# Patient Record
Sex: Male | Born: 1950 | Race: White | Hispanic: No | Smoking: Former smoker
Health system: Southern US, Community
[De-identification: ages and names within clinical notes are randomized; demographics above are authoritative.]

## PROBLEM LIST (undated history)

## (undated) DIAGNOSIS — J45909 Unspecified asthma, uncomplicated: Secondary | ICD-10-CM

## (undated) DIAGNOSIS — I10 Essential (primary) hypertension: Secondary | ICD-10-CM

## (undated) DIAGNOSIS — G4733 Obstructive sleep apnea (adult) (pediatric): Secondary | ICD-10-CM

## (undated) DIAGNOSIS — E785 Hyperlipidemia, unspecified: Secondary | ICD-10-CM

## (undated) DIAGNOSIS — I639 Cerebral infarction, unspecified: Secondary | ICD-10-CM

## (undated) DIAGNOSIS — K219 Gastro-esophageal reflux disease without esophagitis: Secondary | ICD-10-CM

## (undated) DIAGNOSIS — Z9989 Dependence on other enabling machines and devices: Secondary | ICD-10-CM

## (undated) HISTORY — DX: Cerebral infarction, unspecified: I63.9

## (undated) HISTORY — DX: Essential (primary) hypertension: I10

## (undated) HISTORY — PX: HERNIA REPAIR: SHX51

## (undated) HISTORY — DX: Hyperlipidemia, unspecified: E78.5

## (undated) HISTORY — DX: Unspecified asthma, uncomplicated: J45.909

---

## 1994-05-15 HISTORY — PX: ABDOMINAL HERNIA REPAIR: SHX539

## 2016-03-23 ENCOUNTER — Encounter: Payer: Self-pay | Admitting: Neurology

## 2016-04-14 ENCOUNTER — Institutional Professional Consult (permissible substitution): Payer: Self-pay | Admitting: Pulmonary Disease

## 2016-04-17 ENCOUNTER — Ambulatory Visit (INDEPENDENT_AMBULATORY_CARE_PROVIDER_SITE_OTHER): Payer: Medicare Other | Admitting: Pulmonary Disease

## 2016-04-17 ENCOUNTER — Encounter: Payer: Self-pay | Admitting: Pulmonary Disease

## 2016-04-17 VITALS — BP 122/80 | HR 66 | Ht 66.0 in | Wt 223.0 lb

## 2016-04-17 DIAGNOSIS — G4733 Obstructive sleep apnea (adult) (pediatric): Secondary | ICD-10-CM

## 2016-04-17 DIAGNOSIS — Z9989 Dependence on other enabling machines and devices: Secondary | ICD-10-CM

## 2016-04-17 NOTE — Progress Notes (Signed)
Past Surgical History He  has a past surgical history that includes Hernia repair.  Not on File  Family History His family history includes Heart disease in his brother, father, and mother.  Social History He  reports that he quit smoking about 25 years ago. His smoking use included Cigarettes. He has a 15.00 pack-year smoking history. He has never used smokeless tobacco. He reports that he drinks alcohol. He reports that he does not use drugs.  Review of systems Constitutional: Negative for fever and unexpected weight change.  HENT: Negative for congestion, dental problem, ear pain, nosebleeds, postnasal drip, rhinorrhea, sinus pressure, sneezing, sore throat and trouble swallowing.   Eyes: Negative for redness and itching.  Respiratory: Negative for cough, chest tightness, shortness of breath and wheezing.   Cardiovascular: Positive for palpitations. Negative for leg swelling.  Gastrointestinal: Negative for nausea and vomiting.       Acid Heartburn  Genitourinary: Negative for dysuria.  Musculoskeletal: Negative for joint swelling.  Skin: Negative for rash.  Neurological: Negative for headaches.  Hematological: Does not bruise/bleed easily.  Psychiatric/Behavioral: Negative for dysphoric mood. The patient is not nervous/anxious.     No current outpatient prescriptions on file prior to visit.   No current facility-administered medications on file prior to visit.     Chief Complaint  Patient presents with  . SLEEP CONSULT    Referred by Dr Jacinto HalimGanji. Sleep study [place] before 2003. Wears CPAP nightly and with naps.  DME: [facility]: 9    Past medical history He  has a past medical history of Asthma; HTN (hypertension); Hyperlipidemia; OSA (obstructive sleep apnea); and Stroke (HCC).  Vital signs BP 122/80 (BP Location: Left Arm, Cuff Size: Normal)   Pulse 66   Ht 5\' 6"  (1.676 m)   Wt 223 lb (101.2 kg)   SpO2 96%   BMI 35.99 kg/m   History of Present  Illness Joe Underwood is a 65 y.o. male for evaluation of sleep problems.  He had sleep study years ago, and has been on CPAP ever since.  He uses nasal pillows mask.  Mask causes nose irritation at first, but gets better as he uses mask.  He has noticed waking up at times feeling like he can't breath.  He has strong family history of cardiac disease and is followed by Dr. Jacinto HalimGanji.  He had several strokes recently and is scheduled for appointment with Dr. Pearlean BrownieSethi tomorrow.  He goes to sleep at 9 pm.  He falls asleep 15 minus.  He wakes up some times to use the bathroom.  He gets out of bed at 6 am.  He feels okay in the morning.  He denies morning headache.  He does not use anything to help him fall sleep or stay awake.  He denies sleep walking, sleep talking, bruxism, or nightmares.  There is no history of restless legs.  He denies sleep hallucinations, sleep paralysis, or cataplexy.  The Epworth score is 9 out of 24.   Physical Exam:  General - No distress ENT - No sinus tenderness, no oral exudate, no LAN, no thyromegaly, TM clear, pupils equal/reactive, MP 3, elongated uvula Cardiac - s1s2 regular, no murmur, pulses symmetric Chest - No wheeze/rales/dullness, good air entry, normal respiratory excursion Back - No focal tenderness Abd - Soft, non-tender, no organomegaly, + bowel sounds Ext - No edema Neuro - Normal strength, cranial nerves intact Skin - No rashes Psych - Normal mood, and behavior  Discussion: He has history of obstructive  sleep apnea, and has been using CPAP.  He has history of hypertension and recurrent strokes.  He reports more trouble with his sleep, and apnea events even with CPAP.  Assessment/plan:  Obstructive sleep apnea. - will get copy of his CPAP download  - depending on results will determine if he needs to have pressure setting adjusted, or if he needs additional in lab sleep testing - advised him to try using smaller nasal pillows to determine if this  alleviates nasal irrigation when he first starts using new nasal pillows mask   Patient Instructions  Will get copy of CPAP report  Follow up in 4 months   Coralyn HellingVineet Percival Glasheen, M.D. Pager [phone] 04/17/2016, 10:56 AM

## 2016-04-17 NOTE — Progress Notes (Signed)
   Subjective:    Patient ID: Joe Underwood, male    DOB: 1950-10-29, 65 y.o.   MRN: [id number]  HPI    Review of Systems  Constitutional: Negative for fever and unexpected weight change.  HENT: Negative for congestion, dental problem, ear pain, nosebleeds, postnasal drip, rhinorrhea, sinus pressure, sneezing, sore throat and trouble swallowing.   Eyes: Negative for redness and itching.  Respiratory: Negative for cough, chest tightness, shortness of breath and wheezing.   Cardiovascular: Positive for palpitations. Negative for leg swelling.  Gastrointestinal: Negative for nausea and vomiting.       Acid Heartburn  Genitourinary: Negative for dysuria.  Musculoskeletal: Negative for joint swelling.  Skin: Negative for rash.  Neurological: Negative for headaches.  Hematological: Does not bruise/bleed easily.  Psychiatric/Behavioral: Negative for dysphoric mood. The patient is not nervous/anxious.        Objective:   Physical Exam        Assessment & Plan:

## 2016-04-17 NOTE — Patient Instructions (Signed)
Will get copy of CPAP report  Follow up in 4 months

## 2016-04-18 ENCOUNTER — Ambulatory Visit (INDEPENDENT_AMBULATORY_CARE_PROVIDER_SITE_OTHER): Payer: Medicare Other | Admitting: Neurology

## 2016-04-18 ENCOUNTER — Encounter: Payer: Self-pay | Admitting: Neurology

## 2016-04-18 VITALS — BP 129/82 | HR 65 | Ht 66.0 in | Wt 224.0 lb

## 2016-04-18 DIAGNOSIS — I639 Cerebral infarction, unspecified: Secondary | ICD-10-CM

## 2016-04-18 DIAGNOSIS — Q211 Atrial septal defect: Secondary | ICD-10-CM

## 2016-04-18 DIAGNOSIS — Q2112 Patent foramen ovale: Secondary | ICD-10-CM | POA: Insufficient documentation

## 2016-04-18 NOTE — Progress Notes (Signed)
[facility] [address] [place] [address] [phone]       OFFICE CONSULT NOTE  Joe. Joe Underwood Date of Birth:  01-Jan-1951 Medical Record Number:  [id number]   Referring MD:  Mady GemmaKristen Kaplan, PA-C  Reason for Referral:  stroke HPI: Joe Underwood is a pleasant [age] year Caucasian male who [place] had a small stroke. On 11/9 /2017 he was sitting on a computer when he noticed an onset of right hand weakness and difficulty holding a pen picking up the phone using his fingers. [facility] he was evaluated by tele-stroke. CT scan of the head was unremarkable. I have personally reviewed imaging disc has temperature of the CT scan which was normal. MRI images available a incomplete and did not show diffusion-weighted imaging but as per the report patient had a small left. Post central guyrus infarct. 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 MRA of the brain had fetal pattern of origin of the left posterior cerebral artery but no large vessel occlusion or stenosis. Transthoracic echo showed normal ejection fraction. LDL cholesterol was 69 mg percent. Carotid ultrasound showed no significant large vessel stenosis. Patient was seen by neurologist Dr. Judithann [facility]enSparks [facility]. Telemetry monitoring did not reveal cardiac and mass. Patient has had outpatient heart monitor placed yesterday by cardiologist Dr. Mendel CorningiGangi. Patient gives prior history of stroke in 2003 when he had some eye movement abnormalities and double vision. He was found to have a patent foramen ovale and that time. He is scheduled to undergo transesophageal echocardiogram  With Dr Jacinto HalimGanji  on 12/19 17. Patient has been on dual antiplatelet therapy of aspirin and Plavix since his initial stroke and this has been continued though he has no definite long-term indications  informed cardiac stents. Past medical history is fairly benign except mild hypertension and hypertriglyceridemia. Patient does not smoke or drink. He states is physically quite active and and states his right hand weakness improved completely within a few days and he has no residual deficits. He had no recurrent stroke or TIA symptoms. His starting aspirin and Plavix well without significant bruising or bleeding. ROS:   14 system review of systems is positive for  fatigue, palpitations, allergies, slurred speech, confusion and all other systems negative PMH:  Past Medical History:  Diagnosis Date  . Asthma   . HTN (hypertension)   . Hyperlipidemia   . OSA (obstructive sleep apnea)   . Stroke [facility](HCC)    multiple    Social History:  Social History   Social History  . Marital status: Married    Spouse name: N/A  . Number of children: N/A  . Years of education: N/A   Occupational History  . retired    Social History Main Topics  . Smoking status: Former Smoker    Packs/day: 1.00    Years: 15.00    Types: Cigarettes    Quit date: 05/15/1990  . Smokeless tobacco: Never Used  . Alcohol use 0.6 oz/week    1 Shots of liquor per week     Comment: 2 liquor drinks /day  . Drug use: No  . Sexual activity: Not on file   Other Topics Concern  . Not on file   Social History Narrative  . No narrative on file    Medications:   Current Outpatient Prescriptions on File Prior to  Visit  Medication Sig Dispense Refill  . albuterol (PROVENTIL HFA;VENTOLIN HFA) 108 (90 Base) MCG/ACT inhaler Inhale 2 puffs into the lungs every 6 (six) hours as needed for wheezing or shortness of breath.    Marland Kitchen. aspirin 325 MG tablet Take 325 mg by mouth daily.    . Azelastine-Fluticasone (DYMISTA) 137-50 MCG/ACT SUSP Place 2 sprays into the nose daily.    . famotidine (PEPCID) 20 MG tablet Take 20 mg by mouth 2 (two) times daily.    . fexofenadine (ALLEGRA) 180 MG tablet Take 180 mg by mouth daily.    Marland Kitchen.  lisinopril (PRINIVIL,ZESTRIL) 10 MG tablet Take 10 mg by mouth daily.     No current facility-administered medications on file prior to visit.     Allergies:  No Known Allergies  Physical Exam General: mildly obese middle aged Caucasian male, seated, in no evident distress Head: head normocephalic and atraumatic.   Neck: supple with no carotid or supraclavicular bruits Cardiovascular: regular rate and rhythm, no murmurs Musculoskeletal: no deformity Skin:  no rash/petichiae Vascular:  Normal pulses all extremities  Neurologic Exam Mental Status: Awake and fully alert. Oriented to place and time. Recent and remote memory intact. Attention span, concentration and fund of knowledge appropriate. Mood and affect appropriate.  Cranial Nerves: Fundoscopic exam reveals sharp disc margins. Pupils equal, briskly reactive to light. Extraocular movements full without nystagmus. Visual fields full to confrontation. Hearing intact. Facial sensation intact. Face, tongue, palate moves normally and symmetrically.  Motor: Normal bulk and tone. Normal strength in all tested extremity muscles. Sensory.: intact to touch , pinprick , position and vibratory sensation.  Coordination: Rapid alternating movements normal in all extremities. Finger-to-nose and heel-to-shin performed accurately bilaterally. Gait and Station: Arises from chair without difficulty. Stance is normal. Gait demonstrates normal stride length and balance . Able to heel, toe and tandem walk without difficulty.  Reflexes: 1+ and symmetric. Toes downgoing.   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 Known history of patent foramen ovale. Remote brainstem infarct in 2003    PLAN: I had a long d/w patient, wife and daughter about his recent cryptogenic stroke,PFO, risk for recurrent stroke/TIAs, personally independently  reviewed imaging studies and stroke evaluation results and answered questions.Continue aspirin 325 mg daily and dscontinue Plavix  for secondary stroke prevention and maintain strict control of hypertension with blood pressure goal below 130/90, diabetes with hemoglobin A1c goal below 6.5% and lipids with LDL cholesterol goal below 70 mg/dL. I also advised the patient to eat a healthy diet with plenty of whole grains, cereals, fruits and vegetables, exercise regularly and maintain ideal body weight. Check lower extremity venous Doppler for DVT and transcranial Doppler bubble study and emboli monitoring. Keep appointment for transesophageal echocardiogram on 05/02/16 with Dr. Jacinto HalimGanji. Patient may consider endovascular PFO closure if this sizable PFO is found since this is his second stroke .greater than 50% time during this 45 minute onsultation visit was spent on counseling and coordination of care about stroke and PFO, risk of recurrence, prevention and treatment .Followup in the future with me in  2 months or call earlier if necessary Delia HeadyPramod Sethi, MD  [facility] [address] [place], [place] [address]  Phone [phone] Fax [phone] Note: This document was prepared with digital dictation and possible smart phrase technology. Any transcriptional errors that result from this process are unintentional.

## 2016-04-18 NOTE — Patient Instructions (Addendum)
Stroke Prevention Some medical conditions and behaviors are associated with an increased chance of having a stroke. You may prevent a stroke by making healthy choices and managing medical conditions. How can I reduce my risk of having a stroke?  Stay physically active. Get at least 30 minutes of activity on most or all days.  Do not smoke. It may also be helpful to avoid exposure to secondhand smoke.  Limit alcohol use. Moderate alcohol use is considered to be:  No more than 2 drinks per day for men.  No more than 1 drink per day for nonpregnant women.  Eat healthy foods. This involves:  Eating 5 or more servings of fruits and vegetables a day.  Making dietary changes that address high blood pressure (hypertension), high cholesterol, diabetes, or obesity.  Manage your cholesterol levels.  Making food choices that are high in fiber and low in saturated fat, trans fat, and cholesterol may control cholesterol levels.  Take any prescribed medicines to control cholesterol as directed by your health care provider.  Manage your diabetes.  Controlling your carbohydrate and sugar intake is recommended to manage diabetes.  Take any prescribed medicines to control diabetes as directed by your health care provider.  Control your hypertension.  Making food choices that are low in salt (sodium), saturated fat, trans fat, and cholesterol is recommended to manage hypertension.  Ask your health care provider if you need treatment to lower your blood pressure. Take any prescribed medicines to control hypertension as directed by your health care provider.  If you are 18-39 years of age, have your blood pressure checked every 3-5 years. If you are 40 years of age or older, have your blood pressure checked every year.  Maintain a healthy weight.  Reducing calorie intake and making food choices that are low in sodium, saturated fat, trans fat, and cholesterol are recommended to manage  weight.  Stop drug abuse.  Avoid taking birth control pills.  Talk to your health care provider about the risks of taking birth control pills if you are over 35 years old, smoke, get migraines, or have ever had a blood clot.  Get evaluated for sleep disorders (sleep apnea).  Talk to your health care provider about getting a sleep evaluation if you snore a lot or have excessive sleepiness.  Take medicines only as directed by your health care provider.  For some people, aspirin or blood thinners (anticoagulants) are helpful in reducing the risk of forming abnormal blood clots that can lead to stroke. If you have the irregular heart rhythm of atrial fibrillation, you should be on a blood thinner unless there is a good reason you cannot take them.  Understand all your medicine instructions.  Make sure that other conditions (such as anemia or atherosclerosis) are addressed. Get help right away if:  You have sudden weakness or numbness of the face, arm, or leg, especially on one side of the body.  Your face or eyelid droops to one side.  You have sudden confusion.  You have trouble speaking (aphasia) or understanding.  You have sudden trouble seeing in one or both eyes.  You have sudden trouble walking.  You have dizziness.  You have a loss of balance or coordination.  You have a sudden, severe headache with no known cause.  You have new chest pain or an irregular heartbeat. Any of these symptoms may represent a serious problem that is an emergency. Do not wait to see if the symptoms will go away.   Get medical help at once. Call your local emergency services (911 in [place] Do not drive yourself to the hospital. This information is not intended to replace advice given to you by your health care provider. Make sure you discuss any questions you have with your health care provider. Document Released: 06/08/2004 Document Revised: 10/07/2015 Document Reviewed: 11/01/2012 Elsevier  Interactive Patient Education  2017 [employer]vier Inc.  

## 2016-04-26 ENCOUNTER — Ambulatory Visit (INDEPENDENT_AMBULATORY_CARE_PROVIDER_SITE_OTHER): Payer: Medicare Other

## 2016-04-26 ENCOUNTER — Ambulatory Visit: Payer: Medicare Other

## 2016-04-26 DIAGNOSIS — Q211 Atrial septal defect: Secondary | ICD-10-CM

## 2016-04-26 DIAGNOSIS — Q2112 Patent foramen ovale: Secondary | ICD-10-CM

## 2016-04-27 ENCOUNTER — Ambulatory Visit (INDEPENDENT_AMBULATORY_CARE_PROVIDER_SITE_OTHER): Payer: Medicare Other

## 2016-04-27 ENCOUNTER — Encounter: Payer: Self-pay | Admitting: Neurology

## 2016-04-27 DIAGNOSIS — Q211 Atrial septal defect: Secondary | ICD-10-CM | POA: Diagnosis not present

## 2016-04-27 NOTE — Progress Notes (Signed)
Preliminary result  TCD bubble study performed today with spencer degree II at rest and spencer degree III with valsalva. Final report pending.   Marvel PlanJindong June Vacha, MD PhD Stroke Neurology 04/27/2016 11:44 PM

## 2016-05-01 NOTE — H&P (Signed)
OFFICE VISIT NOTES COPIED TO EPIC FOR DOCUMENTATION  . History of Present Illness Joe Underwood(Joe R. Ova Gillentine MD; 04/17/2016 6:50 PM) Patient words: Last O/V 02/23/2016; F/U for Stroke per pt - Pt was recently in the Hospital due to having a Stroke.  The patient is a 65 year old male who presents for a Follow-up for Cerebrovascular accident. He has a history of CVA in 2003, diagnosed after presenting with sudden onset visual disturbances, HTN, and hyperlipidemia. He had a TEE at that time and was reportedly to told have a PFO. He presents today for continued follow up and management given history of vascular disease.  He reports intermittent palpitations, describes as a "flip flopping" which he has had for years. These are infrequent. Otherwise denies any new symptoms or concerns today. Denies any chest pain, SOB, orthopnea, PND, edema, dizziness, syncope, or symptoms of claudication or TIA. I had seen him 1 month ago and he underwent GXT and repeat echo. No new symptoms except still wakes up in spite of using CPAP at night, history does not suggest PND or orthopnea. Denies leg edema dizziness or syncope. No symptoms of claudication.  He was admitted [facility] in [place], [place] on 03/24/2016 with sudden onset right hand weakness. MRI confirmed left postcentral gyrus infarct. Carotid duplex normal without significant stenosis. Echocardiogram did not reveal any significant abnormalities per discharge summary. He was discharged on aspirin and Plavix and advised to follow up here for further evaluation and management.   Problem List/Past Medical (Joe Underwood; 04/17/2016 1:15 PM) Benign essential hypertension (I10)  Labwork  05/18/2015: Creatinine 1.0, potassium 4.4, CMP normal, total cholesterol 148, triglycerides 137, HDL 59, LDL 62, TSH 4.37 Hypercholesteremia (E78.00)  Family history of premature coronary heart disease 12(Z82.49)  Mother had MI in her 4350s. Brother had MI in his 2950s, CABG x 2 Screening  for ischemic heart disease (Z13.6)  Treadmill exercise stress test 12/31/2015: Indications: Screening for CAD The resting electrocardiogram demonstrated normal sinus rhythm, normal resting conduction, no resting arrhythmias and normal rest repolarization. The stress electrocardiogram shows upsloping 2 mm ST depression which was back to baseline at <1 minute intor recovery without recurrence. This is at most equivocal for ischemia. There were no significant arrhythmias. Patient exercised on Bruce protocol for 8:30 minutes and achieved 96% of Max Predicted HR (Target HR was >85% MPHR) and 10.16 METS. Stress symptoms included fatigue and dyspnea. Normal BP response. Exercise capacity was normal. Impression: The stress electrocardiogram shows upsloping 2 mm ST depression which was back to baseline at <1 minute intor recovery without recurrence. This is at most equivocal for ischemia. No significant arrhythmias. Normal BP response. Low risk stress EKG. Clinical correlation recommended. Obstructive sleep apnea on CPAP (G47.33)  Intermittent palpitations (R00.2)  Seasonal allergies (J30.2)  GERD (gastroesophageal reflux disease) (K21.9)  PFO (patent foramen ovale) (Q21.1) 2003 TEE CONFIRMED PFO in 2003. Echocardiogram 01/04/2016: Left ventricle cavity is normal in size. Normal global wall motion. Normal diastolic filling pattern. Calculated EF 65%. Left atrial cavity is normal in size. Aneurysmal interatrial septum without PFO. Trace aortic regurgitation. Trace tricuspid regurgitation. Unable to estimate PA pressure due to absence/minimal TR signal. History of CVA (cerebrovascular accident) (Z86.73) 01/2002 [place], [place]. Presented with visual disturbances.  Allergies (Joe Underwood; 04/17/2016 1:15 PM) No Known Drug Allergies 12/16/2015  Family History (Joe Underwood; 04/17/2016 1:15 PM) Mother  Deceased. at age 66 from an MI; Had 1st one in her 6750's Father  Deceased. at age 66 from an Aortic  Aneurysm; Had  HTN Brother 1  5 Yrs Older; Had MI in his 54's; Had CABG x 2  Social History (Joe Rinaldo Ratel; 22-Apr-2016 1:15 PM) Current tobacco use  Former smoker. Quit 1977 Alcohol Use  Moderate alcohol use. Marital status  Married. Number of Children  2. Living Situation  Lives with spouse.  Past Surgical History (Joe Rinaldo Ratel; 22-Apr-2016 1:15 PM) Hernia Repair 1987  Medication History Joe Pert, MD; 04-22-16 6:57 PM) Simvastatin (20MG  Tablet, 1 Oral daily) Active. (Was switched to Atorva 80 mg for recent stroke in spite of normal LDL due to CVA, Patient prefers to go back on simva) Doxycycline Hyclate (100MG  Capsule, 1 Oral as needed) Active. ProAir HFA (108 (90 Base)MCG/ACT Aerosol Soln, 2 sprays Inhalation as needed) Active. Lisinopril (10MG  Tablet, 1 Oral daily) Active. Clopidogrel Bisulfate (75MG  Tablet, 1 Oral daily) Active. Dymista (137-50MCG/ACT Suspension, 1 spray Nasal two times daily as needed) Active. Aspirin (325MG  Tablet, 1 Oral daily) Active. Allegra (180MG  Tablet, 1 Oral daily) Active. Famotidine (20MG  Tablet, 1 Oral two times daily) Active. Atorvastatin Calcium (80MG  Tablet, 1 Oral daily, Taken starting 03/24/2016) Discontinued: Prefers to go back on simva.. (Stroke 03/24/2016 Normal lipids except elevated TG.) Medications Reconciled (Pt brought list)  Diagnostic Studies History (Joe Underwood; 04/22/2016 1:25 PM) MRI Brain, Brain Stem 03/2016    Review of Systems Joe Pert, MD; Apr 22, 2016 6:53 PM) General Not Present- Anorexia, Fatigue and Fever. Respiratory Not Present- Cough and Difficulty Breathing on Exertion. Cardiovascular Present- Palpitations (Stable). Not Present- Chest Pain, Claudications, Edema, Orthopnea and Paroxysmal Nocturnal Dyspnea. Gastrointestinal Not Present- Black, Tarry Stool, Change in Bowel Habits and Nausea. Neurological Not Present- Focal Neurological Symptoms and Syncope. Endocrine Not Present-  Cold Intolerance, Excessive Sweating, Heat Intolerance and Thyroid Problems. Hematology Not Present- Anemia, Easy Bruising, Petechiae and Prolonged Bleeding.  Vitals (Joe Underwood; 2016/04/22 1:32 PM) 04-22-16 1:20 PM Weight: 223 lb Height: 66in Body Surface Area: 2.09 m Body Mass Index: 35.99 kg/m  Pulse: 68 (Regular)  P.OX: 98% (Room air) BP: 128/88 (Sitting, Left Arm, Standard)       Physical Exam Joe Pert, MD; 04/22/16 6:53 PM) General Mental Status-Alert. General Appearance-Cooperative and Appears stated age. Build & Nutrition-Moderately built and Moderately obese.  Head and Neck Thyroid Gland Characteristics - normal size and consistency and no palpable nodules.  Chest and Lung Exam Chest and lung exam reveals -quiet, even and easy respiratory effort with no use of accessory muscles, non-tender and on auscultation, normal breath sounds, no adventitious sounds.  Cardiovascular Cardiovascular examination reveals -normal heart sounds, regular rate and rhythm with no murmurs, carotid auscultation reveals no bruits, abdominal aorta auscultation reveals no bruits and no prominent pulsation, femoral artery auscultation bilaterally reveals normal pulses, no bruits, no thrills, normal pedal pulses bilaterally and no digital clubbing, cyanosis, edema, increased warmth or tenderness.  Abdomen Inspection Contour - Obese. Palpation/Percussion Normal exam - Non Tender and No hepatosplenomegaly.  Neurologic Neurologic evaluation reveals -alert and oriented x 3 with no impairment of recent or remote memory. Motor-Grossly intact without any focal deficits.  Musculoskeletal Global Assessment Left Lower Extremity - no deformities, masses or tenderness, no known fractures. Right Lower Extremity - no deformities, masses or tenderness, no known fractures.    Assessment & Plan Joe Pert MD; 2016/04/22 6:58 PM) PFO (patent foramen ovale)  (Q21.1) Story: TEE CONFIRMED PFO in 2003.  Echocardiogram 03/23/2016: Normal LV systolic function, mild diastolic dysfunction. Mild LVH. Mild pulmonary hypertension, PA pressure 44 mmHg. History of CVA (cerebrovascular accident) (W09.81) Story:  [facility] in [place], [place] on 03/24/2016 with sudden onset right hand weakness. MRI confirmed left postcentral gyrus infarct.   Presented 01/2002 [place], [place]. Presented with visual disturbances. Told to have PFO by TEE. Current Plans Cardiac event recording for up to 30 days ([id number]) Benign essential hypertension (I10) Impression: EKG 12/16/2015: Normal sinus rhythm at rate of 66 bpm, normal axis. Left atrial abnormality. Low voltage complexes, non specific T wave flattening in anterolateral leads. Current Plans Complete electrocardiogram (93000) Labwork Story: Labs 03/24/2016: HB 14.8/HCT 43.9, platelet 213. BUN 14, serum creatinine 1.06. CMP otherwise normal. Total cholesterol 116, triglycerides 154, HDL 47, LDL 58.   05/18/2015: Creatinine 1.0, potassium 4.4, CMP normal, total cholesterol 148, triglycerides 137, HDL 59, LDL 62, TSH 4.37 Obstructive sleep apnea on CPAP (G47.33) Current Plans Mechanism of underlying disease process and action of medications discussed with the patient. I discussed primary/secondary prevention and also dietary counseling was done. He presents for reevaluation due to recent recurrent CVA, confirmed by MRI. Although echocardiogram again does not mention PFO/ASD, TEE is more sensistive therefore will schedule for this for evaluation. Will also place a 30 day Event Monitor for evaluation of paroxysmal atrial fibrillation as etiology of CVA as he is certainly at risk given obesity and sleep apnea.  His wife and daughter present at the bedside, in the absence of any arrhythmias that are significant for cardioembolic risk, if TEE confirms ASD, advised him that he should proceed with repair as his risk of recurrence is fairly  high. He has been compliant with CPAP. I have explained to them regarding less than 1% risk of death, recurrence of stroke, bleeding, embolic complication and 2-3% risk of atrial arrhythmias during the procedure. He wishes to proceed with PFO closure depending upon event monitoring. He'll see me back after the PFO closure or depending on the event monitor, follow up after testing for reevaluation and further recommendations. This was a greater than 50 minute office visit with greater than 50% of the time spent with face-to-face encounter with patient and evaluation of complex medical issues, review of external records and coordination of care.  CC: Mady GemmaKristen Kaplan, PA-C    Signed by Joe PertJagadeesh R Guilherme Schwenke, MD (04/17/2016 6:59 PM)

## 2016-05-02 ENCOUNTER — Encounter (HOSPITAL_COMMUNITY)
Admission: RE | Disposition: A | Discharge status: Home / Self Care | Payer: Self-pay | Source: Ambulatory Visit | Attending: Cardiology

## 2016-05-02 ENCOUNTER — Ambulatory Visit (HOSPITAL_COMMUNITY): Payer: Medicare Other

## 2016-05-02 ENCOUNTER — Ambulatory Visit (HOSPITAL_BASED_OUTPATIENT_CLINIC_OR_DEPARTMENT_OTHER)
Admission: RE | Admit: 2016-05-02 | Discharge: 2016-05-02 | Disposition: A | Discharge status: Home / Self Care | Payer: Medicare Other | Source: Ambulatory Visit | Attending: Neurology | Admitting: Neurology

## 2016-05-02 ENCOUNTER — Ambulatory Visit (HOSPITAL_COMMUNITY)
Admission: RE | Admit: 2016-05-02 | Discharge: 2016-05-02 | Disposition: A | Discharge status: Home / Self Care | Payer: Medicare Other | Source: Ambulatory Visit | Attending: Cardiology | Admitting: Cardiology

## 2016-05-02 ENCOUNTER — Encounter (HOSPITAL_COMMUNITY): Payer: Self-pay | Admitting: *Deleted

## 2016-05-02 DIAGNOSIS — E668 Other obesity: Secondary | ICD-10-CM | POA: Diagnosis not present

## 2016-05-02 DIAGNOSIS — Z7982 Long term (current) use of aspirin: Secondary | ICD-10-CM | POA: Diagnosis not present

## 2016-05-02 DIAGNOSIS — I371 Nonrheumatic pulmonary valve insufficiency: Secondary | ICD-10-CM | POA: Insufficient documentation

## 2016-05-02 DIAGNOSIS — Z8249 Family history of ischemic heart disease and other diseases of the circulatory system: Secondary | ICD-10-CM | POA: Insufficient documentation

## 2016-05-02 DIAGNOSIS — Z79899 Other long term (current) drug therapy: Secondary | ICD-10-CM | POA: Insufficient documentation

## 2016-05-02 DIAGNOSIS — E7800 Pure hypercholesterolemia, unspecified: Secondary | ICD-10-CM | POA: Diagnosis not present

## 2016-05-02 DIAGNOSIS — K219 Gastro-esophageal reflux disease without esophagitis: Secondary | ICD-10-CM | POA: Diagnosis not present

## 2016-05-02 DIAGNOSIS — I10 Essential (primary) hypertension: Secondary | ICD-10-CM | POA: Diagnosis not present

## 2016-05-02 DIAGNOSIS — Z87891 Personal history of nicotine dependence: Secondary | ICD-10-CM | POA: Diagnosis not present

## 2016-05-02 DIAGNOSIS — Z6836 Body mass index (BMI) 36.0-36.9, adult: Secondary | ICD-10-CM | POA: Insufficient documentation

## 2016-05-02 DIAGNOSIS — G4733 Obstructive sleep apnea (adult) (pediatric): Secondary | ICD-10-CM | POA: Diagnosis not present

## 2016-05-02 DIAGNOSIS — Z8673 Personal history of transient ischemic attack (TIA), and cerebral infarction without residual deficits: Secondary | ICD-10-CM | POA: Diagnosis not present

## 2016-05-02 DIAGNOSIS — Q2112 Patent foramen ovale: Secondary | ICD-10-CM

## 2016-05-02 DIAGNOSIS — E785 Hyperlipidemia, unspecified: Secondary | ICD-10-CM | POA: Diagnosis not present

## 2016-05-02 DIAGNOSIS — I351 Nonrheumatic aortic (valve) insufficiency: Secondary | ICD-10-CM | POA: Insufficient documentation

## 2016-05-02 DIAGNOSIS — Q211 Atrial septal defect: Secondary | ICD-10-CM | POA: Diagnosis not present

## 2016-05-02 HISTORY — PX: TEE WITHOUT CARDIOVERSION: SHX5443

## 2016-05-02 SURGERY — ECHOCARDIOGRAM, TRANSESOPHAGEAL
Anesthesia: Moderate Sedation

## 2016-05-02 MED ORDER — FENTANYL CITRATE (PF) 100 MCG/2ML IJ SOLN
INTRAMUSCULAR | Status: DC | PRN
  Administered 2016-05-02: 25 ug via INTRAVENOUS
  Administered 2016-05-02: 50 ug via INTRAVENOUS

## 2016-05-02 MED ORDER — BUTAMBEN-TETRACAINE-BENZOCAINE 2-2-14 % EX AERO
INHALATION_SPRAY | CUTANEOUS | Status: DC | PRN
  Administered 2016-05-02: 2 via TOPICAL

## 2016-05-02 MED ORDER — MIDAZOLAM HCL 10 MG/2ML IJ SOLN
INTRAMUSCULAR | Status: DC | PRN
  Administered 2016-05-02: 1 mg via INTRAVENOUS
  Administered 2016-05-02: 2 mg via INTRAVENOUS

## 2016-05-02 MED ORDER — SODIUM CHLORIDE 0.9 % IV SOLN: INTRAVENOUS | Status: DC

## 2016-05-02 MED ORDER — MIDAZOLAM HCL 5 MG/ML IJ SOLN
INTRAMUSCULAR | Status: AC
  Filled 2016-05-02: qty 2

## 2016-05-02 MED ORDER — FENTANYL CITRATE (PF) 100 MCG/2ML IJ SOLN
INTRAMUSCULAR | Status: AC
  Filled 2016-05-02: qty 2

## 2016-05-02 NOTE — Interval H&P Note (Signed)
History and Physical Interval Note:  05/02/2016 1:18 PM  Joe LushKenneth Hernandez  has presented today for surgery, with the diagnosis of PSO  The various methods of treatment have been discussed with the patient and family. After consideration of risks, benefits and other options for treatment, the patient has consented to  Procedure(s): TRANSESOPHAGEAL ECHOCARDIOGRAM (TEE) (N/A) as a surgical intervention .  The patient's history has been reviewed, patient examined, no change in status, stable for surgery.  I have reviewed the patient's chart and labs.  Questions were answered to the patient's satisfaction.     [name], Zivah Mayr

## 2016-05-02 NOTE — Progress Notes (Signed)
VASCULAR LAB PRELIMINARY  PRELIMINARY  PRELIMINARY  PRELIMINARY  Bilateral lower extremity venous duplex completed.    Preliminary report:  Bilateral:  No evidence of DVT, superficial thrombosis, or Baker's Cyst.   Joe Underwood, RV12/19/2017,

## 2016-05-02 NOTE — CV Procedure (Signed)
TEE: Under moderate sedation, TEE was performed without complications: LV: Normal. Normal EF. RV: Normal2 LA: Normal. Left atrial appendage: Normal without thrombus. Normal function.  Inter atrial septum shows a medium sized PFO with weakly positive right to left shunting at rest and strongly positive shunting with Valsalva at the level of the PFO.  Marland Kitchen. RA: Normal  MV: Normal Mild MR. TV: Normal Trace TR AV: Mild AI. PV: Normal. Mild PI.  Thoracic and ascending aorta: Mild atheromatous changes.  Conscious sedation protocol was followed, I personally administered conscious sedation and monitored the patient. Patient received 3 milligrams of Versed and 75mcg fentalyl . Patient tolerated the procedure well and there was no complication from conscious sedation. Time administered was 15 minutes.

## 2016-05-02 NOTE — Progress Notes (Deleted)
VASCULAR LAB PRELIMINARY  PRELIMINARY  PRELIMINARY  PRELIMINARY  Bilateral lower extremity venous duplex completed.    Preliminary report:  Bilateral:  No evidence of DVT, superficial thrombosis, or Baker's Cyst.   Anthonella Klausner, RVS 05/02/2016, 4:14 PM

## 2016-05-02 NOTE — Progress Notes (Signed)
  Echocardiogram Echocardiogram Transesophageal has been performed.  Joe Underwood L Androw 05/02/2016, 2:12 PM 

## 2016-05-02 NOTE — Discharge Instructions (Signed)

## 2016-05-03 ENCOUNTER — Encounter (HOSPITAL_COMMUNITY): Payer: Self-pay | Admitting: Cardiology

## 2016-05-17 ENCOUNTER — Telehealth: Payer: Self-pay

## 2016-05-17 NOTE — Telephone Encounter (Signed)
-----   Message from Micki RileyPramod S Sethi, MD sent at 05/12/2016  4:01 PM EST ----- Kindly inform the patient that lower extremity venous Doppler study was negative for DVT

## 2016-05-17 NOTE — Telephone Encounter (Signed)
Rn call patient that her lower extremity venous doppler study was negative for dvt. Pt verbalized understanding.

## 2016-05-21 NOTE — H&P (Signed)
OFFICE VISIT NOTES COPIED TO EPIC FOR DOCUMENTATION   History of Present Illness Joe Pert MD; 2016/05/13 6:50 PM) Patient words: Last O/V 02/23/2016; F/U for Stroke per pt - Pt was recently in the Hospital due to having a Stroke.  The patient is a 66 year old male who presents for a Follow-up for Cerebrovascular accident. He has a history of CVA in 2003, diagnosed after presenting with sudden onset visual disturbances, HTN, and hyperlipidemia. He had a TEE at that time and was reportedly to told have a PFO. He presents today for continued follow up and management given history of vascular disease.  He reports intermittent palpitations, describes as a "flip flopping" which he has had for years. These are infrequent. Otherwise denies any new symptoms or concerns today. Denies any chest pain, SOB, orthopnea, PND, edema, dizziness, syncope, or symptoms of claudication or TIA. I had seen him 1 month ago and he underwent GXT and repeat echo. No new symptoms except still wakes up in spite of using CPAP at night, history does not suggest PND or orthopnea. Denies leg edema dizziness or syncope. No symptoms of claudication.  He was admitted to [facility] in [place], [place] on 03/24/2016 with sudden onset right hand weakness. MRI confirmed left postcentral gyrus infarct. Carotid duplex normal without significant stenosis. Echocardiogram did not reveal any significant abnormalities per discharge summary. He was discharged on aspirin and Plavix and advised to follow up here for further evaluation and management.   Problem List/Past Medical ([name]; 13-May-2016 1:15 PM) Benign essential hypertension (I10)  Labwork  05/18/2015: Creatinine 1.0, potassium 4.4, CMP normal, total cholesterol 148, triglycerides 137, HDL 59, LDL 62, TSH 4.37 Hypercholesteremia (E78.00)  Family history of premature coronary heart disease (Z33.49)  Mother had MI in her 3s. Brother had MI in his 90s, CABG x 2 Screening  for ischemic heart disease (Z13.6)  Treadmill exercise stress test 12/31/2015: Indications: Screening for CAD The resting electrocardiogram demonstrated normal sinus rhythm, normal resting conduction, no resting arrhythmias and normal rest repolarization. The stress electrocardiogram shows upsloping 2 mm ST depression which was back to baseline at <1 minute intor recovery without recurrence. This is at most equivocal for ischemia. There were no significant arrhythmias. Patient exercised on Bruce protocol for 8:30 minutes and achieved 96% of Max Predicted HR (Target HR was >85% MPHR) and 10.16 METS. Stress symptoms included fatigue and dyspnea. Normal BP response. Exercise capacity was normal. Impression: The stress electrocardiogram shows upsloping 2 mm ST depression which was back to baseline at <1 minute intor recovery without recurrence. This is at most equivocal for ischemia. No significant arrhythmias. Normal BP response. Low risk stress EKG. Clinical correlation recommended. Obstructive sleep apnea on CPAP (G47.33)  Intermittent palpitations (R00.2)  Seasonal allergies (J30.2)  GERD (gastroesophageal reflux disease) (K21.9)  PFO (patent foramen ovale) (Q21.1) 2003 TEE CONFIRMED PFO in 2003. Echocardiogram 01/04/2016: Left ventricle cavity is normal in size. Normal global wall motion. Normal diastolic filling pattern. Calculated EF 65%. Left atrial cavity is normal in size. Aneurysmal interatrial septum without PFO. Trace aortic regurgitation. Trace tricuspid regurgitation. Unable to estimate PA pressure due to absence/minimal TR signal. History of CVA (cerebrovascular accident) (Z86.73) 01/2002 [place], [place]. Presented with visual disturbances.  Allergies ([name]; 2016/05/13 1:15 PM) No Known Drug Allergies 12/16/2015  Family History (April Rinaldo Ratel; 2016-05-13 1:15 PM) Mother  Deceased. at age 62 from an MI; Had 1st one in her 13's Father  Deceased. at age 66 from an Aortic  Aneurysm; Had  HTN Brother 1  5 Yrs Older; Had MI in his 6450's; Had CABG x 2  Social History ([name]; 04/17/2016 1:15 PM) Current tobacco use  Former smoker. Quit 1977 Alcohol Use  Moderate alcohol use. Marital status  Married. Number of Children  2. Living Situation  Lives with spouse.  Past Surgical History ([name]; 04/17/2016 1:15 PM) Hernia Repair 1987  Medication History Joe Underwood(Jagadeesh R Karolyne Timmons, MD; 04/17/2016 6:57 PM) Simvastatin (20MG  Tablet, 1 Oral daily) Active. (Was switched to Atorva 80 mg for recent stroke in spite of normal LDL due to CVA, Patient prefers to go back on simva) Doxycycline Hyclate (100MG  Capsule, 1 Oral as needed) Active. ProAir HFA (108 (90 Base)MCG/ACT Aerosol Soln, 2 sprays Inhalation as needed) Active. Lisinopril (10MG  Tablet, 1 Oral daily) Active. Clopidogrel Bisulfate (75MG  Tablet, 1 Oral daily) Active. Dymista (137-50MCG/ACT Suspension, 1 spray Nasal two times daily as needed) Active. Aspirin (325MG  Tablet, 1 Oral daily) Active. Allegra (180MG  Tablet, 1 Oral daily) Active. Famotidine (20MG  Tablet, 1 Oral two times daily) Active. Atorvastatin Calcium (80MG  Tablet, 1 Oral daily, Taken starting 03/24/2016) Discontinued: Prefers to go back on simva.. (Stroke 03/24/2016 Normal lipids except elevated TG.) Medications Reconciled (Pt brought list)  Diagnostic Studies History ([name]; 04/17/2016 1:25 PM) MRI Brain, Brain Stem 03/2016    Review of Systems Joe Underwood(Jagadeesh R Necola Bluestein, MD; 04/17/2016 6:53 PM) General Not Present- Anorexia, Fatigue and Fever. Respiratory Not Present- Cough and Difficulty Breathing on Exertion. Cardiovascular Present- Palpitations (Stable). Not Present- Chest Pain, Claudications, Edema, Orthopnea and Paroxysmal Nocturnal Dyspnea. Gastrointestinal Not Present- Black, Tarry Stool, Change in Bowel Habits and Nausea. Neurological Not Present- Focal Neurological Symptoms and Syncope. Endocrine Not Present-  Cold Intolerance, Excessive Sweating, Heat Intolerance and Thyroid Problems. Hematology Not Present- Anemia, Easy Bruising, Petechiae and Prolonged Bleeding.  Vitals ([name]; 04/17/2016 1:32 PM) 04/17/2016 1:20 PM Weight: 223 lb Height: 66in Body Surface Area: 2.09 m Body Mass Index: 35.99 kg/m  Pulse: 68 (Regular)  P.OX: 98% (Room air) BP: 128/88 (Sitting, Left Arm, Standard)       Physical Exam Joe Underwood(Jagadeesh R Blanca Thornton, MD; 04/17/2016 6:53 PM) General Mental Status-Alert. General Appearance-Cooperative and Appears stated age. Build & Nutrition-Moderately built and Moderately obese.  Head and Neck Thyroid Gland Characteristics - normal size and consistency and no palpable nodules.  Chest and Lung Exam Chest and lung exam reveals -quiet, even and easy respiratory effort with no use of accessory muscles, non-tender and on auscultation, normal breath sounds, no adventitious sounds.  Cardiovascular Cardiovascular examination reveals -normal heart sounds, regular rate and rhythm with no murmurs, carotid auscultation reveals no bruits, abdominal aorta auscultation reveals no bruits and no prominent pulsation, femoral artery auscultation bilaterally reveals normal pulses, no bruits, no thrills, normal pedal pulses bilaterally and no digital clubbing, cyanosis, edema, increased warmth or tenderness.  Abdomen Inspection Contour - Obese. Palpation/Percussion Normal exam - Non Tender and No hepatosplenomegaly.  Neurologic Neurologic evaluation reveals -alert and oriented x 3 with no impairment of recent or remote memory. Motor-Grossly intact without any focal deficits.  Musculoskeletal Global Assessment Left Lower Extremity - no deformities, masses or tenderness, no known fractures. Right Lower Extremity - no deformities, masses or tenderness, no known fractures.    Assessment & Plan Joe Underwood(Jagadeesh R. Maribel Hadley MD; 04/17/2016 6:58 PM) PFO (patent foramen ovale)  (Q21.1) TEE 05/02/2016:Strongly positive for right-to-left shunting through a moderate to large PFO otherwise normal.  Echocardiogram 03/23/2016: Normal LV systolic function, mild diastolic dysfunction. Mild LVH. Mild pulmonary hypertension, PA pressure 44  mmHg. History of CVA (cerebrovascular accident) (Z8.73) Story: [facility] in [place], [place] on 03/24/2016 with sudden onset right hand weakness. MRI confirmed left postcentral gyrus infarct.   Presented 01/2002 [place], [place]. Presented with visual disturbances. Told to have PFO by TEE. Current Plans Cardiac event recording for up to 30 days (16109) Benign essential hypertension (I10) Impression: EKG 12/16/2015: Normal sinus rhythm at rate of 66 bpm, normal axis. Left atrial abnormality. Low voltage complexes, non specific T wave flattening in anterolateral leads. Current Plans Complete electrocardiogram (93000) Labwork Story: Labs 03/24/2016: HB 14.8/HCT 43.9, platelet 213. BUN 14, serum creatinine 1.06. CMP otherwise normal. Total cholesterol 116, triglycerides 154, HDL 47, LDL 58.  05/18/2015: Creatinine 1.0, potassium 4.4, CMP normal, total cholesterol 148, triglycerides 137, HDL 59, LDL 62, TSH 4.37  Obstructive sleep apnea on CPAP (G47.33) Current Plans Mechanism of underlying disease process and action of medications discussed with the patient. I discussed primary/secondary prevention and also dietary counseling was done. He presents for reevaluation due to recent recurrent CVA, confirmed by MRI.   TEE confirms moderate to large size PFO with strongly positive right-to-left shunting. 30 day event wanted does not reveal any atrialfibrillation.  I had previously discussed with his wife and daughter present at the bedside, in the absence of any arrhythmias that are significant for cardioembolic risk, advised him that he should proceed with repair as his risk of recurrence is fairly high. He has been compliant with CPAP. I have explained to  them regarding less than 1% risk of death, recurrence of stroke, bleeding, embolic complication and 2-3% risk of atrial arrhythmias during the procedure. He wishes to proceed with PFO closure depending upon event monitoring. He'll see me back after the PFO closure.   CC: Mady Gemma, PA-C  Signed by Joe Pert, MD (04/17/2016 6:59 PM)

## 2016-05-23 ENCOUNTER — Ambulatory Visit (HOSPITAL_COMMUNITY)
Admission: RE | Admit: 2016-05-23 | Discharge: 2016-05-24 | Disposition: A | Discharge status: Home / Self Care | Payer: Medicare Other | Source: Ambulatory Visit | Attending: Cardiology | Admitting: Cardiology

## 2016-05-23 ENCOUNTER — Encounter (HOSPITAL_COMMUNITY)
Admission: RE | Disposition: A | Discharge status: Home / Self Care | Payer: Self-pay | Source: Ambulatory Visit | Attending: Cardiology

## 2016-05-23 ENCOUNTER — Encounter (HOSPITAL_COMMUNITY): Payer: Self-pay | Admitting: Cardiology

## 2016-05-23 DIAGNOSIS — Z8249 Family history of ischemic heart disease and other diseases of the circulatory system: Secondary | ICD-10-CM | POA: Diagnosis not present

## 2016-05-23 DIAGNOSIS — Z87891 Personal history of nicotine dependence: Secondary | ICD-10-CM | POA: Diagnosis not present

## 2016-05-23 DIAGNOSIS — E785 Hyperlipidemia, unspecified: Secondary | ICD-10-CM | POA: Insufficient documentation

## 2016-05-23 DIAGNOSIS — I10 Essential (primary) hypertension: Secondary | ICD-10-CM | POA: Diagnosis not present

## 2016-05-23 DIAGNOSIS — Z7982 Long term (current) use of aspirin: Secondary | ICD-10-CM | POA: Insufficient documentation

## 2016-05-23 DIAGNOSIS — K219 Gastro-esophageal reflux disease without esophagitis: Secondary | ICD-10-CM | POA: Diagnosis not present

## 2016-05-23 DIAGNOSIS — E7800 Pure hypercholesterolemia, unspecified: Secondary | ICD-10-CM | POA: Diagnosis not present

## 2016-05-23 DIAGNOSIS — Z7902 Long term (current) use of antithrombotics/antiplatelets: Secondary | ICD-10-CM | POA: Insufficient documentation

## 2016-05-23 DIAGNOSIS — Q211 Atrial septal defect: Secondary | ICD-10-CM | POA: Insufficient documentation

## 2016-05-23 DIAGNOSIS — G459 Transient cerebral ischemic attack, unspecified: Secondary | ICD-10-CM | POA: Insufficient documentation

## 2016-05-23 DIAGNOSIS — G4733 Obstructive sleep apnea (adult) (pediatric): Secondary | ICD-10-CM | POA: Diagnosis not present

## 2016-05-23 DIAGNOSIS — I639 Cerebral infarction, unspecified: Secondary | ICD-10-CM | POA: Diagnosis present

## 2016-05-23 DIAGNOSIS — Q2112 Patent foramen ovale: Secondary | ICD-10-CM

## 2016-05-23 DIAGNOSIS — Z8774 Personal history of (corrected) congenital malformations of heart and circulatory system: Secondary | ICD-10-CM

## 2016-05-23 HISTORY — PX: PATENT FORAMEN OVALE CLOSURE: SHX2181

## 2016-05-23 HISTORY — DX: Dependence on other enabling machines and devices: Z99.89

## 2016-05-23 HISTORY — DX: Gastro-esophageal reflux disease without esophagitis: K21.9

## 2016-05-23 HISTORY — DX: Obstructive sleep apnea (adult) (pediatric): G47.33

## 2016-05-23 LAB — POCT ACTIVATED CLOTTING TIME
ACTIVATED CLOTTING TIME: 279 s
Activated Clotting Time: 213 seconds
Activated Clotting Time: 180 seconds

## 2016-05-23 SURGERY — PATENT FORAMEN OVALE (PFO) CLOSURE

## 2016-05-23 MED ORDER — CLOPIDOGREL BISULFATE 300 MG PO TABS
ORAL_TABLET | ORAL | Status: DC | PRN
  Administered 2016-05-23: 300 mg via ORAL

## 2016-05-23 MED ORDER — SODIUM CHLORIDE 0.9% FLUSH: 3.0000 mL | INTRAVENOUS | Status: DC | PRN

## 2016-05-23 MED ORDER — SODIUM CHLORIDE 0.9% FLUSH
3.0000 mL | Freq: Two times a day (BID) | INTRAVENOUS | Status: DC
  Administered 2016-05-23: 14:00:00 3 mL via INTRAVENOUS

## 2016-05-23 MED ORDER — ALBUTEROL SULFATE (2.5 MG/3ML) 0.083% IN NEBU: 3.0000 mL | INHALATION_SOLUTION | Freq: Four times a day (QID) | RESPIRATORY_TRACT | Status: DC | PRN

## 2016-05-23 MED ORDER — CLOPIDOGREL BISULFATE 300 MG PO TABS
ORAL_TABLET | ORAL | Status: AC
  Filled 2016-05-23: qty 1

## 2016-05-23 MED ORDER — CLOPIDOGREL BISULFATE 75 MG PO TABS
75.0000 mg | ORAL_TABLET | Freq: Every day | ORAL | Status: DC
  Administered 2016-05-24: 75 mg via ORAL
  Filled 2016-05-23: qty 1

## 2016-05-23 MED ORDER — ONDANSETRON HCL 4 MG/2ML IJ SOLN: 4.0000 mg | Freq: Four times a day (QID) | INTRAMUSCULAR | Status: DC | PRN

## 2016-05-23 MED ORDER — MIDAZOLAM HCL 2 MG/2ML IJ SOLN
INTRAMUSCULAR | Status: AC
  Filled 2016-05-23: qty 2

## 2016-05-23 MED ORDER — LORATADINE 10 MG PO TABS
10.0000 mg | ORAL_TABLET | Freq: Every day | ORAL | Status: DC
  Administered 2016-05-23: 10 mg via ORAL
  Filled 2016-05-23: qty 1

## 2016-05-23 MED ORDER — SODIUM CHLORIDE 0.9 % IV SOLN: 250.0000 mL | INTRAVENOUS | Status: DC | PRN

## 2016-05-23 MED ORDER — HEPARIN SODIUM (PORCINE) 1000 UNIT/ML IJ SOLN
INTRAMUSCULAR | Status: DC | PRN
  Administered 2016-05-23: 7000 [IU] via INTRAVENOUS

## 2016-05-23 MED ORDER — ASPIRIN 81 MG PO CHEW
81.0000 mg | CHEWABLE_TABLET | ORAL | Status: AC
  Administered 2016-05-23: 81 mg via ORAL

## 2016-05-23 MED ORDER — LIDOCAINE HCL (PF) 1 % IJ SOLN
INTRAMUSCULAR | Status: AC
  Filled 2016-05-23: qty 30

## 2016-05-23 MED ORDER — HYDROMORPHONE HCL 1 MG/ML IJ SOLN
INTRAMUSCULAR | Status: AC
  Filled 2016-05-23: qty 1

## 2016-05-23 MED ORDER — LIDOCAINE HCL (PF) 1 % IJ SOLN
INTRAMUSCULAR | Status: DC | PRN
Start: 2016-05-23 — End: 2016-05-23
  Administered 2016-05-23: 20 mL

## 2016-05-23 MED ORDER — CEFAZOLIN SODIUM-DEXTROSE 2-4 GM/100ML-% IV SOLN
INTRAVENOUS | Status: AC
  Filled 2016-05-23: qty 100

## 2016-05-23 MED ORDER — AZELASTINE-FLUTICASONE 137-50 MCG/ACT NA SUSP
2.0000 | Freq: Every day | NASAL | Status: DC | PRN
Start: 2016-05-23 — End: 2016-05-23

## 2016-05-23 MED ORDER — MIDAZOLAM HCL 2 MG/2ML IJ SOLN
INTRAMUSCULAR | Status: DC | PRN
Start: 2016-05-23 — End: 2016-05-23
  Administered 2016-05-23 (×2): 2 mg via INTRAVENOUS
  Administered 2016-05-23: 1 mg via INTRAVENOUS

## 2016-05-23 MED ORDER — ASPIRIN 325 MG PO TABS: 325.0000 mg | ORAL_TABLET | Freq: Every day | ORAL | Status: DC

## 2016-05-23 MED ORDER — LISINOPRIL 10 MG PO TABS
10.0000 mg | ORAL_TABLET | Freq: Every day | ORAL | Status: DC
  Administered 2016-05-23: 10 mg via ORAL
  Filled 2016-05-23: qty 1

## 2016-05-23 MED ORDER — HEPARIN (PORCINE) IN NACL 2-0.9 UNIT/ML-% IJ SOLN
INTRAMUSCULAR | Status: DC | PRN
  Administered 2016-05-23: 1000 mL

## 2016-05-23 MED ORDER — ACETAMINOPHEN 325 MG PO TABS: 650.0000 mg | ORAL_TABLET | ORAL | Status: DC | PRN

## 2016-05-23 MED ORDER — SODIUM CHLORIDE 0.9 % IV SOLN
INTRAVENOUS | Status: DC
  Administered 2016-05-23: 06:00:00 via INTRAVENOUS

## 2016-05-23 MED ORDER — HEPARIN SODIUM (PORCINE) 1000 UNIT/ML IJ SOLN
INTRAMUSCULAR | Status: AC
  Filled 2016-05-23: qty 1

## 2016-05-23 MED ORDER — CEFAZOLIN SODIUM-DEXTROSE 2-3 GM-% IV SOLR
INTRAVENOUS | Status: DC | PRN
  Administered 2016-05-23: 2 g via INTRAVENOUS

## 2016-05-23 MED ORDER — FAMOTIDINE 20 MG PO TABS
20.0000 mg | ORAL_TABLET | Freq: Two times a day (BID) | ORAL | Status: DC
  Administered 2016-05-23 – 2016-05-24 (×2): 20 mg via ORAL
  Filled 2016-05-23 (×2): qty 1

## 2016-05-23 MED ORDER — HEPARIN (PORCINE) IN NACL 2-0.9 UNIT/ML-% IJ SOLN
INTRAMUSCULAR | Status: AC
  Filled 2016-05-23: qty 500

## 2016-05-23 MED ORDER — HYDROMORPHONE HCL 1 MG/ML IJ SOLN
INTRAMUSCULAR | Status: DC | PRN
  Administered 2016-05-23: 0.5 mg via INTRAVENOUS

## 2016-05-23 MED ORDER — SODIUM CHLORIDE 0.9% FLUSH: 3.0000 mL | Freq: Two times a day (BID) | INTRAVENOUS | Status: DC

## 2016-05-23 MED ORDER — SIMVASTATIN 20 MG PO TABS
20.0000 mg | ORAL_TABLET | Freq: Every day | ORAL | Status: DC
  Administered 2016-05-23: 21:00:00 20 mg via ORAL
  Filled 2016-05-23: qty 1

## 2016-05-23 MED ORDER — ASPIRIN 81 MG PO CHEW
CHEWABLE_TABLET | ORAL | Status: AC
  Filled 2016-05-23: qty 1

## 2016-05-23 MED ORDER — CLOPIDOGREL BISULFATE 75 MG PO TABS
300.0000 mg | ORAL_TABLET | ORAL | Status: AC
  Administered 2016-05-23: 300 mg via ORAL

## 2016-05-23 SURGICAL SUPPLY — 16 items
BALLN SIZING AMPLATZER 24 (BALLOONS) ×2
BALLN SIZING AMPLATZER 24MM (BALLOONS) ×1
BALLOON SIZING AMPLATZER 24 (BALLOONS) ×1 IMPLANT
CATH ACUNAV REPROCESSED (CATHETERS) ×3 IMPLANT
CATH INFINITI 6F MPA2 100CM (CATHETERS) ×3 IMPLANT
COVER SWIFTLINK CONNECTOR (BAG) ×3 IMPLANT
GUIDEWIRE AMPLATZER 1.5JX260 (WIRE) ×3 IMPLANT
GUIDEWIRE ANGLED .035X150CM (WIRE) ×3 IMPLANT
OCCLUDER AMPLATZER PFO 25MM (Prosthesis & Implant Heart) ×3 IMPLANT
PACK CARDIAC CATHETERIZATION (CUSTOM PROCEDURE TRAY) ×3 IMPLANT
SET INTRODUCER MICROPUNCT 5F (INTRODUCER) ×3 IMPLANT
SHEATH INTROD W/O MIN 9FR 25CM (SHEATH) ×3 IMPLANT
SHEATH PINNACLE 6F 10CM (SHEATH) ×3 IMPLANT
SHEATH PINNACLE 8F 10CM (SHEATH) ×3 IMPLANT
SYSTEM DELIVERY AMPLATZER 8FR (SHEATH) ×3 IMPLANT
WIRE EMERALD 3MM-J .035X150CM (WIRE) ×3 IMPLANT

## 2016-05-23 NOTE — Care Management Note (Signed)
Case Management Note  Patient Details  Name: Joe LushKenneth Underwood MRN: [id number] Date of Birth: [birth year]  Subjective/Objective:   S/p pfo closure, on plavix and asa, NCM will cont to follow for dc needs.                 Action/Plan:   Expected Discharge Date:  05/24/16               Expected Discharge Plan:  Home/Self Care  In-House Referral:     Discharge planning Services  CM Consult  Post Acute Care Choice:    Choice offered to:     DME Arranged:    DME Agency:     HH Arranged:    HH Agency:     Status of Service:  Completed, signed off  If discussed at [employer] Length of Stay Meetings, dates discussed:    Additional Comments:  Leone Havenaylor, Lucillie Kiesel Clinton, RN 05/23/2016, 3:50 PM

## 2016-05-23 NOTE — Progress Notes (Addendum)
Site area: RFV x 2 Site Prior to Removal:  Level 0 Pressure Applied For:20 min Manual:   yes Patient Status During Pull:  stable Post Pull Site:  Level 0 Post Pull Instructions Given:   Post Pull Pulses Present: palpable Dressing Applied:  tegaderrm Bedrest begins @ 1025 till 1425 Comments:

## 2016-05-23 NOTE — Interval H&P Note (Signed)
History and Physical Interval Note:  05/23/2016 7:51 AM  Joe Underwood  has presented today for surgery, with the diagnosis of pfo  The various methods of treatment have been discussed with the patient and family. After consideration of risks, benefits and other options for treatment, the patient has consented to  Procedure(s): Patent Forament Ovale(PFO) Closure (N/A) as a surgical intervention .  The patient's history has been reviewed, patient examined, no change in status, stable for surgery.  I have reviewed the patient's chart and labs.  Questions were answered to the patient's satisfaction.     [name], [name]

## 2016-05-24 ENCOUNTER — Ambulatory Visit (HOSPITAL_COMMUNITY): Payer: Medicare Other

## 2016-05-24 DIAGNOSIS — Q211 Atrial septal defect: Secondary | ICD-10-CM | POA: Diagnosis not present

## 2016-05-24 LAB — ECHOCARDIOGRAM LIMITED
E/e' ratio: 5.88
EWDT: 292 ms
FS: 46 % — AB (ref 28–44)
Height: 67 in
IVS/LV PW RATIO, ED: 1.14
LA ID, A-P, ES: 36 mm
LA diam end sys: 36 mm
LA diam index: 1.71 cm/m2
LV E/e'average: 5.88
LV TDI E'LATERAL: 8.33
LV TDI E'MEDIAL: 7.09
LV e' LATERAL: 8.33 cm/s
LVEEMED: 5.88
LVOT VTI: 24.3 cm
LVOT area: 3.14 cm2
LVOT peak grad rest: 8 mmHg
LVOTD: 20 mm
LVOTPV: 139 cm/s
LVOTSV: 76 mL
MV Dec: 292
MV pk A vel: 77.6 m/s
MVPKEVEL: 49 m/s
PW: 9.01 mm — AB (ref 0.6–1.1)
Weight: 3534.41 oz

## 2016-05-24 MED ORDER — ASPIRIN EC 81 MG PO TBEC: 81.0000 mg | DELAYED_RELEASE_TABLET | Freq: Every day | ORAL | Status: DC

## 2016-05-24 MED ORDER — CLOPIDOGREL BISULFATE 75 MG PO TABS: 75.0000 mg | ORAL_TABLET | Freq: Every day | ORAL | 0 refills | Status: DC

## 2016-05-24 NOTE — Progress Notes (Signed)
Pt left floor, ambulatory with no signs of distress. Pt verbalized discharge instructions.

## 2016-05-24 NOTE — Discharge Summary (Signed)
Physician Discharge Summary  Patient ID: Joe LushKenneth Underwood MRN: [id number] DOB/AGE: 02/09/1951 66 y.o.  Admit date: 05/23/2016 Discharge date: 05/24/2016  Primary Discharge Diagnosis Cryptogenic stroke Patent Foramen Ovale S/P Intracardiac Echo Guided closure of the atrial septal defect: Type: PFO with a 25 mm PFO Septal Occluder.  Secondary Discharge Diagnosis Hypertension Obstructive sleep apnea on CPAP  Significant Diagnostic Studies: echocardiogram 05/24/2016: No evidence of pericardial effusion. Atrial septal defect closure device in good position without thrombus or residual shunting by color Doppler.  Hospital Course: Patient is a 66 year old Caucasian man with history of Recurrent stroke. Other etiology for cryptogenic stroke were ruled out and TEE had confirmed that patient had a moderate to large PFO with strongly positive double contrast study for right to left shunting. 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 Hence brought to the cardiac catheterization suite for closure of the PFO for secondary prevention of stroke.  He underwent successful procedure performed on 928 and the following morning felt stable for discharge.  There was no groin site complication.  There was no arrhythmias noted on telemetry.   Recommendations on discharge: Patient will be continued on aspirin 81 mg indefinitely and Plavix 75 mg for a period of 3 months.  He will need endocarditis prophylaxis for a period of 6 months.  He can resume activities and 48 hours.  Discharge Exam: Blood pressure 127/72, pulse 72, temperature 97.6 F (36.4 C), temperature source Oral, resp. rate (!) 22, height 5\' 7"  (1.702 m), weight 100.2 kg (220 lb 14.4 oz), SpO2 95 %.   General appearance: alert, cooperative, appears stated age, no distress and mildly obese Resp: clear to auscultation bilaterally Cardio: regular rate and rhythm, S1, S2 normal, no murmur, click, rub or gallop GI: soft, non-tender; bowel  sounds normal; no masses,  no organomegaly Extremities: extremities normal, atraumatic, no cyanosis or edema Pulses: 2+ and symmetric Right groin site has healed well Neurologic: Grossly normal Labs:  No results found for: WBC, HGB, HCT, MCV, PLT No results for input(s): NA, K, CL, CO2, BUN, CREATININE, CALCIUM, PROT, BILITOT, ALKPHOS, ALT, AST, GLUCOSE in the last 168 hours.  Invalid input(s): LABALBU  Lipid Panel  No results found for: CHOL, TRIG, HDL, CHOLHDL, VLDL, LDLCALC  BNP (last 3 results) No results for input(s): BNP in the last 8760 hours.  HEMOGLOBIN A1C No results found for: HGBA1C, MPG  Cardiac Panel (last 3 results) No results for input(s): CKTOTAL, CKMB, TROPONINI, RELINDX in the last 8760 hours.  No results found for: CKTOTAL, CKMB, CKMBINDEX, TROPONINI   TSH No results for input(s): TSH in the last 8760 hours.  EKG: normal EKG, normal sinus rhythm, unchanged from previous tracings.  FOLLOW UP PLANS AND APPOINTMENTS Discharge Instructions    Diet - low sodium heart healthy    Complete by:  As directed    Increase activity slowly    Complete by:  As directed      Allergies as of 05/24/2016   No Known Allergies     Medication List    STOP taking these medications   aspirin 325 MG tablet Replaced by:  aspirin EC 81 MG tablet     TAKE these medications   albuterol 108 (90 Base) MCG/ACT inhaler Commonly known as:  PROVENTIL HFA;VENTOLIN HFA Inhale 2 puffs into the lungs every 6 (six) hours as needed for wheezing or shortness of breath.   aspirin EC 81 MG tablet Take 1 tablet (81 mg total) by mouth daily. Replaces:  aspirin 325 MG tablet   clopidogrel 75 MG tablet Commonly known as:  PLAVIX Take 1 tablet (75 mg total) by mouth daily with breakfast. Start taking on:  05/25/2016   DYMISTA 137-50 MCG/ACT Susp Generic drug:  Azelastine-Fluticasone Place 2 sprays into the nose daily as needed (post nasal drip).   famotidine 20 MG tablet Commonly  known as:  PEPCID Take 20 mg by mouth 2 (two) times daily.   fexofenadine 180 MG tablet Commonly known as:  ALLEGRA Take 180 mg by mouth at bedtime.   lisinopril 10 MG tablet Commonly known as:  PRINIVIL,ZESTRIL Take 10 mg by mouth at bedtime.   simvastatin 20 MG tablet Commonly known as:  ZOCOR Take 20 mg by mouth at bedtime.       Yates Decamp, MD 05/24/2016, 2:57 PM  Pager: [phone] Office: [phone] If no answer: [phone]

## 2016-05-24 NOTE — Progress Notes (Signed)
  Echocardiogram 2D Echocardiogram has been performed.  Delcie RochENNINGTON, Alecsander Hattabaugh 05/24/2016, 10:34 AM

## 2016-05-30 ENCOUNTER — Encounter (HOSPITAL_COMMUNITY): Payer: Self-pay

## 2016-05-30 ENCOUNTER — Ambulatory Visit (HOSPITAL_COMMUNITY): Admit: 2016-05-30 | Payer: Medicare Other | Admitting: Cardiology

## 2016-05-30 SURGERY — CARDIOVERSION
Anesthesia: Monitor Anesthesia Care

## 2016-06-27 ENCOUNTER — Ambulatory Visit: Payer: Medicare Other | Admitting: Neurology

## 2016-06-30 ENCOUNTER — Emergency Department (HOSPITAL_COMMUNITY): Payer: Medicare Other

## 2016-06-30 ENCOUNTER — Emergency Department (HOSPITAL_COMMUNITY)
Admission: EM | Admit: 2016-06-30 | Discharge: 2016-06-30 | Disposition: A | Discharge status: Home / Self Care | Payer: Medicare Other | Attending: Emergency Medicine | Admitting: Emergency Medicine

## 2016-06-30 ENCOUNTER — Encounter (HOSPITAL_COMMUNITY): Payer: Self-pay

## 2016-06-30 DIAGNOSIS — R0602 Shortness of breath: Secondary | ICD-10-CM | POA: Diagnosis present

## 2016-06-30 DIAGNOSIS — Z87891 Personal history of nicotine dependence: Secondary | ICD-10-CM | POA: Diagnosis not present

## 2016-06-30 DIAGNOSIS — Z8673 Personal history of transient ischemic attack (TIA), and cerebral infarction without residual deficits: Secondary | ICD-10-CM | POA: Diagnosis not present

## 2016-06-30 DIAGNOSIS — R21 Rash and other nonspecific skin eruption: Secondary | ICD-10-CM | POA: Insufficient documentation

## 2016-06-30 DIAGNOSIS — J45909 Unspecified asthma, uncomplicated: Secondary | ICD-10-CM | POA: Diagnosis not present

## 2016-06-30 DIAGNOSIS — I10 Essential (primary) hypertension: Secondary | ICD-10-CM | POA: Insufficient documentation

## 2016-06-30 DIAGNOSIS — R0600 Dyspnea, unspecified: Secondary | ICD-10-CM | POA: Diagnosis not present

## 2016-06-30 DIAGNOSIS — Z7982 Long term (current) use of aspirin: Secondary | ICD-10-CM | POA: Insufficient documentation

## 2016-06-30 DIAGNOSIS — Z79899 Other long term (current) drug therapy: Secondary | ICD-10-CM | POA: Insufficient documentation

## 2016-06-30 LAB — BASIC METABOLIC PANEL
ANION GAP: 14 (ref 5–15)
BUN: 15 mg/dL (ref 6–20)
CALCIUM: 9 mg/dL (ref 8.9–10.3)
CO2: 22 mmol/L (ref 22–32)
Chloride: 106 mmol/L (ref 101–111)
Creatinine, Ser: 1.05 mg/dL (ref 0.61–1.24)
GFR calc Af Amer: >60 mL/min (ref >60)
Glucose, Bld: 86 mg/dL (ref 65–99)
POTASSIUM: 4.1 mmol/L (ref 3.5–5.1)
SODIUM: 142 mmol/L (ref 135–145)

## 2016-06-30 LAB — CBC
HCT: 45.3 % (ref 39.0–52.0)
Hemoglobin: 15.3 g/dL (ref 13.0–17.0)
MCH: 30.5 pg (ref 26.0–34.0)
MCHC: 33.8 g/dL (ref 30.0–36.0)
MCV: 90.4 fL (ref 78.0–100.0)
PLATELETS: 156 10*3/uL (ref 150–400)
RBC: 5.01 MIL/uL (ref 4.22–5.81)
RDW: 13.5 % (ref 11.5–15.5)
WBC: 5.1 10*3/uL (ref 4.0–10.5)

## 2016-06-30 MED ORDER — ALBUTEROL SULFATE (2.5 MG/3ML) 0.083% IN NEBU
5.0000 mg | INHALATION_SOLUTION | Freq: Once | RESPIRATORY_TRACT | Status: AC
  Administered 2016-06-30: 5 mg via RESPIRATORY_TRACT
  Filled 2016-06-30: qty 6

## 2016-06-30 MED ORDER — ALBUTEROL SULFATE HFA 108 (90 BASE) MCG/ACT IN AERS: 1.0000 | INHALATION_SPRAY | Freq: Four times a day (QID) | RESPIRATORY_TRACT | 0 refills | Status: DC | PRN

## 2016-06-30 NOTE — ED Triage Notes (Signed)
Pt states that he was recently diagnosed with the flu, tonight SOB started, pt has rash with redness and itching around his neck. On tamiflu

## 2016-06-30 NOTE — ED Notes (Signed)
Pt ambulated with pulse ox without difficulty. O2 sats remained upper 90's. Denied SOB while walking

## 2016-06-30 NOTE — ED Provider Notes (Signed)
[facility]-EMERGENCY DEPT Provider Note   CSN: [id number] Arrival date & time: 06/30/16  0105     History   Chief Complaint Chief Complaint  Patient presents with  . Shortness of Breath  . Rash    HPI Joe Underwood is a 66 y.o. male.  The history is provided by the patient.  Shortness of Breath  This is a new problem. Episode onset: prior to arrival. The problem has been resolved. Associated symptoms include cough and rash. Pertinent negatives include no chest pain and no vomiting. Precipitated by: influenza. Associated medical issues include asthma.  Rash     Patient presents for multiple complaints He was recently diagnosed with influenza earlier in the week, placed on tamiflu and hycodan with some improved No fever today He reports cough continues No vomiting He reports mild rash to his upper body that has some itching that started in past 24 hrs  He also reports while using his home CPAP, he felt SOB and took it off but still felt SOB No CP He is now at baseline   Family also notes "swelling" in bilateral neck that is worse from prior No lip/tongue swelling No difficulty swallowing reported   No foreign travel No HA No neck stiffness  Past Medical History:  Diagnosis Date  . Asthma   . GERD (gastroesophageal reflux disease)   . HTN (hypertension)   . Hyperlipidemia   . OSA on CPAP   . Stroke [facility]) 2003; 03/2016   denies any residual on 05/23/2016    Patient Active Problem List   Diagnosis Date Noted  . Status post primum atrial septal defect repair 05/23/2016  . Cryptogenic stroke (HCC) 04/18/2016  . PFO (patent foramen ovale) 04/18/2016    Past Surgical History:  Procedure Laterality Date  . ABDOMINAL HERNIA REPAIR  1996  . HERNIA REPAIR    . PATENT FORAMEN OVALE CLOSURE N/A 05/23/2016   Procedure: Patent Forament Ovale(PFO) Closure;  Surgeon: Yates Decamp, MD;  Location: [facility] INVASIVE CV LAB;  Service: Cardiovascular;  Laterality: N/A;  . TEE WITHOUT  CARDIOVERSION N/A 05/02/2016   Procedure: TRANSESOPHAGEAL ECHOCARDIOGRAM (TEE);  Surgeon: Yates Decamp, MD;  Location: [facility] ENDOSCOPY;  Service: Cardiovascular;  Laterality: N/A;       Home Medications    Prior to Admission medications   Medication Sig Start Date End Date Taking? Authorizing Provider  albuterol (PROVENTIL HFA;VENTOLIN HFA) 108 (90 Base) MCG/ACT inhaler Inhale 1-2 puffs into the lungs every 6 (six) hours as needed for wheezing or shortness of breath. 06/30/16   Zadie Rhine, MD  aspirin EC 81 MG tablet Take 1 tablet (81 mg total) by mouth daily. 05/24/16   Yates Decamp, MD  Azelastine-Fluticasone (DYMISTA) 137-50 MCG/ACT SUSP Place 2 sprays into the nose daily as needed (post nasal drip).     Historical Provider, MD  clopidogrel (PLAVIX) 75 MG tablet Take 1 tablet (75 mg total) by mouth daily with breakfast. 05/25/16   Yates Decamp, MD  famotidine (PEPCID) 20 MG tablet Take 20 mg by mouth 2 (two) times daily.    Historical Provider, MD  fexofenadine (ALLEGRA) 180 MG tablet Take 180 mg by mouth at bedtime.     Historical Provider, MD  lisinopril (PRINIVIL,ZESTRIL) 10 MG tablet Take 10 mg by mouth at bedtime.     Historical Provider, MD  simvastatin (ZOCOR) 20 MG tablet Take 20 mg by mouth at bedtime.  01/29/16   Historical Provider, MD    Family History Family History  Problem Relation Age  of Onset  . Heart disease Mother   . Heart attack Mother   . Heart disease Father   . Heart attack Father   . Heart disease Brother   . Stroke Brother     Social History Social History  Substance Use Topics  . Smoking status: Former Smoker    Packs/day: 1.00    Years: 13.00    Types: Cigarettes    Quit date: 06/11/1980  . Smokeless tobacco: Never Used  . Alcohol use 16.8 oz/week    28 Shots of liquor per week     Comment: 2, 2 shots of  liquor drinks /day     Allergies   Patient has no known allergies.   Review of Systems Review of Systems  HENT: Negative for trouble  swallowing.   Respiratory: Positive for cough and shortness of breath.   Cardiovascular: Negative for chest pain.  Gastrointestinal: Negative for vomiting.  Skin: Positive for rash.  All other systems reviewed and are negative.    Physical Exam Updated Vital Signs BP 118/95   Pulse (!) 57   Temp 97.7 F (36.5 C) (Oral)   Resp 13   SpO2 96%   Physical Exam CONSTITUTIONAL: Well developed/well nourished HEAD: Normocephalic/atraumatic EYES: EOMI/PERRL ENMT: Mucous membranes moist, uvula midline, no erythema/exudates noted.  No stridor.  No angioedema NECK: supple no meningeal signs, mild swelling noted to bilateral neck that is nontender, no erythema/edema, no firm nodules noted, no anterior neck lymphadenopathy  SPINE/BACK:entire spine nontender CV: S1/S2 noted, no murmurs/rubs/gallops noted LUNGS: scattered wheeze noted, no apparent distress ABDOMEN: soft, nontender GU:no cva tenderness NEURO: Pt is awake/alert/appropriate, moves all extremitiesx4.  No facial droop.   EXTREMITIES: pulses normal/equal, full ROM SKIN: warm, scattered papules that blanch to upper chest.   No rash to palms of hands PSYCH: no abnormalities of mood noted, alert and oriented to situation   ED Treatments / Results  Labs (all labs ordered are listed, but only abnormal results are displayed) Labs Reviewed  CBC  BASIC METABOLIC PANEL    EKG  EKG Interpretation  Date/Time:  Friday June 30 2016 01:14:23 EST Ventricular Rate:  58 PR Interval:  166 QRS Duration: 84 QT Interval:  438 QTC Calculation: 429 R Axis:   26 Text Interpretation:  Sinus bradycardia Low voltage QRS Borderline ECG When compared with ECG of EARLIER SAME DATE No significant change was found Confirmed by Preston FleetingGLICK  MD, DAVID ([id number]) on 06/30/2016 1:18:40 AM       Radiology Dg Chest 2 View  Result Date: 06/30/2016 CLINICAL DATA:  Acute onset of shortness of breath and neck swelling. Recently diagnosed with flu. Initial  encounter. EXAM: CHEST  2 VIEW COMPARISON:  None. FINDINGS: The lungs are well-aerated. Minimal bibasilar atelectasis is noted. There is no evidence of pleural effusion or pneumothorax. The heart is normal in size; the mediastinal contour is within normal limits. No acute osseous abnormalities are seen. IMPRESSION: Minimal bibasilar atelectasis noted.  Lungs otherwise clear. Electronically Signed   By: Roanna RaiderJeffery  Chang M.D.   On: 06/30/2016 01:31    Procedures Procedures (including critical care time)  Medications Ordered in ED Medications  albuterol (PROVENTIL) (2.5 MG/3ML) 0.083% nebulizer solution 5 mg (5 mg Nebulization Given 06/30/16 0521)     Initial Impression / Assessment and Plan / ED Course  I have reviewed the triage vital signs and the nursing notes.  Pertinent labs & imaging results that were available during my care of the patient were reviewed by me  and considered in my medical decision making (see chart for details).     Pt well appearing He was improved with nebs He ambulated without any difficulty No hypoxia upon ambulation He had no symptoms on ambulation Will d/c home with albuterol For rash, could be allergic as no other new exposures other than meds Advised to STOP tamiflu since he is improved from flu standpoint  As for ?swelling, does not appear allergic in nature, no tender lymphadenopathy He can followup for this with PCP   No signs of ACS/PE/CHF at this time   Final Clinical Impressions(s) / ED Diagnoses   Final diagnoses:  Rash  Dyspnea, unspecified type    New Prescriptions Discharge Medication List as of 06/30/2016  6:38 AM       Zadie Rhine, MD 06/30/16 [phone]

## 2016-07-11 ENCOUNTER — Encounter: Payer: Self-pay | Admitting: Neurology

## 2016-07-11 ENCOUNTER — Ambulatory Visit (INDEPENDENT_AMBULATORY_CARE_PROVIDER_SITE_OTHER): Payer: Medicare Other | Admitting: Neurology

## 2016-07-11 ENCOUNTER — Telehealth: Payer: Self-pay | Admitting: Neurology

## 2016-07-11 VITALS — BP 99/73 | HR 55 | Wt 231.5 lb

## 2016-07-11 DIAGNOSIS — Q211 Atrial septal defect: Secondary | ICD-10-CM

## 2016-07-11 DIAGNOSIS — Q2112 Patent foramen ovale: Secondary | ICD-10-CM

## 2016-07-11 NOTE — Telephone Encounter (Signed)
Not needed as he has PFO closure and we need post closure f/u TCD Bubble only

## 2016-07-11 NOTE — Telephone Encounter (Signed)
Dr. Pearlean BrownieSethi can you please place a order for Monitoring . Thanks Annabelle Harmanana

## 2016-07-11 NOTE — Patient Instructions (Signed)
I had a long d/w patient about his remote stroke, PFO and PFO closure, transient atrial fibrillation, risk for recurrent stroke/TIAs, personally independently reviewed imaging studies and stroke evaluation results and answered questions.Continue aspirin 81 mg daily and Xarelto (rivaroxaban) daily  for secondary stroke prevention and maintain strict control of hypertension with blood pressure goal below 130/90, diabetes with hemoglobin A1c goal below 6.5% and lipids with LDL cholesterol goal below 70 mg/dL. I also advised the patient to eat a healthy diet with plenty of whole grains, cereals, fruits and vegetables, exercise regularly and maintain ideal body weight. Check f/u TCD Bubble study in April 2018 for adequacy of PFO closure Followup in the future with me in  6 months or call earlier if necessary.

## 2016-07-11 NOTE — Progress Notes (Signed)
[facility] [address] [place]. [place] [address] [phone]       OFFICE FOLLOW UP VISIT NOTE  Mr. Hubert Raatz Date of Birth:  July 25, 1950 Medical Record Number:  [id number]   Referring MD:  Mady Gemma, PA-C  Reason for Referral:  stroke HPI: Initial visit 04/18/16; Mr Isbell is a pleasant 66 year Caucasian male who while [place] in November 2017 had a small stroke. On 11/9 /2017 he was sitting on a computer when he noticed an onset of right hand weakness and difficulty holding a pen picking up the phone using his fingers. He was taken to [facility] where he was evaluated by tele-stroke. CT scan of the head was unremarkable. I have personally reviewed imaging disc has temperature of the CT scan which was normal. MRI images available a incomplete and did not show diffusion-weighted imaging but as per the report patient had a small left. Post central guyrus infarct. 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 MRA of the brain had fetal pattern of origin of the left posterior cerebral artery but no large vessel occlusion or stenosis. Transthoracic echo showed normal ejection fraction. LDL cholesterol was 69 mg percent. Carotid ultrasound showed no significant large vessel stenosis. Patient was seen by neurologist Dr. Judithann Sheen from [facility] neurology. Telemetry monitoring did not reveal cardiac and mass. Patient has had outpatient heart monitor placed yesterday by cardiologist Dr. Mendel Corning. Patient gives prior history of stroke in 2003 when he had some eye movement abnormalities and double vision. He was found to have a patent foramen ovale and that time. He is scheduled to undergo transesophageal echocardiogram  With Dr Jacinto Halim  on 12/19 17. Patient has been on dual antiplatelet therapy of aspirin and Plavix since his initial stroke and this has been continued though he has no  definite long-term indications informed cardiac stents. Past medical history is fairly benign except mild hypertension and hypertriglyceridemia. Patient does not smoke or drink. He states is physically quite active and and states his right hand weakness improved completely within a few days and he has no residual deficits. He had no recurrent stroke or TIA symptoms. His starting aspirin and Plavix well without significant bruising or bleeding. He was seen in the emergency room on 06/30/16 with shortness of breath and a new rash. Improved with nebulizer treatment. He was asked to stop Tamiflu his rash also got better. Update 07/11/2016 ;  he returns for follow-up after last visit 2 and half months ago. He did undergo transcranial Doppler bubble study on 12/14-17 by Dr. Roda Shutters which confirmed a moderate size right to left intracardiac shunt. He saw Dr. Jacinto Halim and had endovascular PFO closure using Amplatzer device on 05/23/16. The procedure went initially well but 2 days later the patient developed transient atrial fibrillation lasting about 6 hours. He has been started on Xarelto as well as metoprolol since then. He has remained in sinus rhythm. He is tolerating Xarelto well without bleeding or bruising. Is also on aspirin. He states his blood pressure is well controlled today in fact it is low at 99/73. He is tolerating Zocor well without muscle aches or pains. He has no new complaints today. ROS:   14 system review of systems is positive for  environmental allergies, rash, shortness of breath,apnea only and all other systems negative PMH:  Past Medical History:  Diagnosis Date  . Asthma   . GERD (gastroesophageal  reflux disease)   . HTN (hypertension)   . Hyperlipidemia   . OSA on CPAP   . Stroke [facility]) 2003; 03/2016   denies any residual on 05/23/2016    Social History:  Social History   Social History  . Marital status: Married    Spouse name: N/A  . Number of children: N/A  . Years of education: N/A     Occupational History  . retired    Social History Main Topics  . Smoking status: Former Smoker    Packs/day: 1.00    Years: 13.00    Types: Cigarettes    Quit date: 06/11/1980  . Smokeless tobacco: Never Used  . Alcohol use 16.8 oz/week    28 Shots of liquor per week     Comment: 2, 2 shots of  liquor drinks ocassionally  . Drug use: No  . Sexual activity: Not on file   Other Topics Concern  . Not on file   Social History Narrative  . No narrative on file    Medications:   Current Outpatient Prescriptions on File Prior to Visit  Medication Sig Dispense Refill  . albuterol (PROVENTIL HFA;VENTOLIN HFA) 108 (90 Base) MCG/ACT inhaler Inhale 1-2 puffs into the lungs every 6 (six) hours as needed for wheezing or shortness of breath. 1 Inhaler 0  . aspirin EC 81 MG tablet Take 1 tablet (81 mg total) by mouth daily.    . Azelastine-Fluticasone (DYMISTA) 137-50 MCG/ACT SUSP Place 2 sprays into the nose daily as needed (post nasal drip).     . famotidine (PEPCID) 20 MG tablet Take 20 mg by mouth 2 (two) times daily.    . fexofenadine (ALLEGRA) 180 MG tablet Take 180 mg by mouth at bedtime.     Marland Kitchen lisinopril (PRINIVIL,ZESTRIL) 10 MG tablet Take 10 mg by mouth at bedtime.     . simvastatin (ZOCOR) 20 MG tablet Take 20 mg by mouth at bedtime.      No current facility-administered medications on file prior to visit.     Allergies:   Allergies  Allergen Reactions  . Oseltamivir Rash    Physical Exam General: mildly obese middle aged Caucasian male, seated, in no evident distress Head: head normocephalic and atraumatic.   Neck: supple with no carotid or supraclavicular bruits Cardiovascular: regular rate and rhythm, no murmurs Musculoskeletal: no deformity Skin:  no rash/petichiae Vascular:  Normal pulses all extremities  Neurologic Exam Mental Status: Awake and fully alert. Oriented to place and time. Recent and remote memory intact. Attention span, concentration and fund of  knowledge appropriate. Mood and affect appropriate.  Cranial Nerves: Fundoscopic exam not done Pupils equal, briskly reactive to light. Extraocular movements full without nystagmus. Visual fields full to confrontation. Hearing intact. Facial sensation intact. Face, tongue, palate moves normally and symmetrically.  Motor: Normal bulk and tone. Normal strength in all tested extremity muscles. Sensory.: intact to touch , pinprick , position and vibratory sensation.  Coordination: Rapid alternating movements normal in all extremities. Finger-to-nose and heel-to-shin performed accurately bilaterally. Gait and Station: Arises from chair without difficulty. Stance is normal. Gait demonstrates normal stride length and balance . Able to heel, toe and tandem walk without difficulty.  Reflexes: 1+ and symmetric. Toes downgoing.       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 Known history of patent foramen ovale. Remote brainstem infarct in 2003    PLAN: I had a long d/w patient about  his remote stroke, PFO and PFO closure, transient atrial fibrillation, risk for recurrent stroke/TIAs, personally independently reviewed imaging studies and stroke evaluation results and answered questions.Continue aspirin 81 mg daily and Xarelto (rivaroxaban) daily  for secondary stroke prevention and maintain strict control of hypertension with blood pressure goal below 130/90, diabetes with hemoglobin A1c goal below 6.5% and lipids with LDL cholesterol goal below 70 mg/dL. I also advised the patient to eat a healthy diet with plenty of whole grains, cereals, fruits and vegetables, exercise regularly and maintain ideal body weight. Check f/u TCD Bubble study in April 2018 for adequacy of PFO closure. Greater than 50% time during this 25 minute visit was spent on counseling about PFO closure, stroke risk, prevention and treatment option  discussion and answering questions. Followup in the future with me in  6 months or call earlier if necessary. Delia HeadyPramod Sethi, MD  [facility] [address] [place], [place] [id number]  Phone [phone] Fax [phone] Note: This document was prepared with digital dictation and possible smart phrase technology. Any transcriptional errors that result from this process are unintentional.

## 2016-07-12 NOTE — Telephone Encounter (Signed)
Noted Ok I will let Tim No. Thanks Annabelle Harmanana

## 2016-08-16 ENCOUNTER — Other Ambulatory Visit: Payer: Self-pay | Admitting: *Deleted

## 2016-08-16 ENCOUNTER — Ambulatory Visit: Payer: Medicare Other

## 2016-08-16 DIAGNOSIS — Q2112 Patent foramen ovale: Secondary | ICD-10-CM

## 2016-08-16 DIAGNOSIS — Q211 Atrial septal defect: Secondary | ICD-10-CM

## 2016-08-22 ENCOUNTER — Ambulatory Visit (HOSPITAL_COMMUNITY)
Admission: RE | Admit: 2016-08-22 | Discharge: 2016-08-22 | Disposition: A | Discharge status: Home / Self Care | Payer: Medicare Other | Source: Ambulatory Visit | Attending: Neurology | Admitting: Neurology

## 2016-08-22 ENCOUNTER — Other Ambulatory Visit: Payer: Self-pay | Admitting: Neurology

## 2016-08-22 DIAGNOSIS — Q2112 Patent foramen ovale: Secondary | ICD-10-CM

## 2016-08-22 DIAGNOSIS — Q211 Atrial septal defect: Secondary | ICD-10-CM | POA: Diagnosis not present

## 2016-08-22 NOTE — Progress Notes (Signed)
*  PRELIMINARY RESULTS* Vascular Ultrasound Transcranial Doppler bubble study completed.  Performed by Dr. Roda Shutters. Left hand IV was used. Right MCA insonated.  No HITS heard at rest or with Valsalva.  Apparent successful PFO closure.    Farrel Demark, RDMS, RVT  08/22/2016, 3:59 PM

## 2016-08-23 ENCOUNTER — Ambulatory Visit (INDEPENDENT_AMBULATORY_CARE_PROVIDER_SITE_OTHER): Payer: Medicare Other | Admitting: Pulmonary Disease

## 2016-08-23 ENCOUNTER — Encounter: Payer: Self-pay | Admitting: Pulmonary Disease

## 2016-08-23 ENCOUNTER — Telehealth: Payer: Self-pay

## 2016-08-23 VITALS — BP 134/80 | HR 68 | Ht 66.0 in | Wt 233.6 lb

## 2016-08-23 DIAGNOSIS — R0602 Shortness of breath: Secondary | ICD-10-CM

## 2016-08-23 DIAGNOSIS — G4733 Obstructive sleep apnea (adult) (pediatric): Secondary | ICD-10-CM

## 2016-08-23 DIAGNOSIS — F458 Other somatoform disorders: Secondary | ICD-10-CM | POA: Diagnosis not present

## 2016-08-23 DIAGNOSIS — Z789 Other specified health status: Secondary | ICD-10-CM

## 2016-08-23 NOTE — Patient Instructions (Signed)
Will arrange for new CPAP machine  Can look up [email or web address] for mask options  Follow up in 3 months

## 2016-08-23 NOTE — Telephone Encounter (Signed)
Rn call patient that the TCD bubble study shows successful PFO closure. PT verbalized understanding.

## 2016-08-23 NOTE — Progress Notes (Addendum)
Current Outpatient Prescriptions on File Prior to Visit  Medication Sig  . albuterol (PROVENTIL HFA;VENTOLIN HFA) 108 (90 Base) MCG/ACT inhaler Inhale 1-2 puffs into the lungs every 6 (six) hours as needed for wheezing or shortness of breath.  Marland Kitchen aspirin EC 81 MG tablet Take 1 tablet (81 mg total) by mouth daily.  . Azelastine-Fluticasone (DYMISTA) 137-50 MCG/ACT SUSP Place 2 sprays into the nose daily as needed (post nasal drip).   . famotidine (PEPCID) 20 MG tablet Take 20 mg by mouth 2 (two) times daily.  . fexofenadine (ALLEGRA) 180 MG tablet Take 180 mg by mouth at bedtime.   Marland Kitchen lisinopril (PRINIVIL,ZESTRIL) 10 MG tablet Take 10 mg by mouth at bedtime.   . metoprolol succinate (TOPROL-XL) 100 MG 24 hr tablet Take 100 mg by mouth.  . simvastatin (ZOCOR) 20 MG tablet Take 20 mg by mouth at bedtime.   Carlena Hurl 20 MG TABS tablet TK 1 T PO QPM AFTER DINNER   No current facility-administered medications on file prior to visit.      Chief Complaint  Patient presents with  . Follow-up    OSA, pt states he is having some smothering issues whiuch wakes him up frequently at night     Sleep tests CPAP 07/24/16 to 08/22/16 >> used on 30 of 30 nights with average 9 hrs 39 min  Cardiac tests Echo 05/24/16 >> EF 55 to 60%  Past medical history Asthma, Allergic rhinitis, GERD, HTN, HLD, CVA, PFO  Past surgical history, Family history, Social history, Allergies all reviewed  Vital Signs BP 134/80 (BP Location: Left Arm, Cuff Size: Large)   Pulse 68   Ht  (1.676 m)   Wt 233 lb 9.6 oz (106 kg)   SpO2 98%   BMI 37.70 kg/m   History of Present Illness Joe Underwood is a 66 y.o. male with obstructive sleep apnea.  He is having trouble using CPAP.  He has nasal mask.  He feels bloated and short of breath.  He feels like he is swallowing air into his stomach.  As a result he has disrupted sleep.  He is feeling more tired during the day.    Physical Exam  General - pleasant Eyes -  pupils reactive ENT - no sinus tenderness, no oral exudate, no LAN Cardiac - regular, no murmur Chest - no wheeze, rales Abd - soft, non tender Ext - no edema Skin - no rashes Neuro - normal strength Psych - normal mood  Assessment/Plan  Obstructive sleep apnea. - he is using CPAP and gets benefit - he is having dyspnea and aerophagia with difficulty tolerating nasal mask - his machine if more than 67 years old - will first arrange for new auto CPAP machine - explained he might need repeat sleep study prior to getting new machine >> can be home sleep study - if his symptoms persist will then need to arrange for mask refitting   Patient Instructions  Will arrange for new CPAP machine  Can look up [email or web address] for mask options  Follow up in 3 months  Time spent 27 minutes face to face  Coralyn Helling, MD [facility]/Sleep Pager:  [phone] 08/23/2016, 9:12 AM

## 2016-08-23 NOTE — Telephone Encounter (Signed)
-----   Message from Micki Riley, MD sent at 08/23/2016  8:40 AM EDT ----- Joneen Roach inform patient that TCD Bubble study shows successful PFO closure

## 2016-09-06 ENCOUNTER — Telehealth: Payer: Self-pay | Admitting: Pulmonary Disease

## 2016-09-06 NOTE — Telephone Encounter (Signed)
Pt returning call and can be reached @ [phone]-.Caren Griffins

## 2016-09-06 NOTE — Telephone Encounter (Signed)
Spoke with pt and he was very angry raising his voice because Dr. Craige Cotta will not make changes to his note, informed pt of VS's message, pt states [facility] told him that if he does not change his note he will need to pay out of pocket for new machine and he states he will not do that. It is after hours and unable to reach anyone from [employer].

## 2016-09-06 NOTE — Telephone Encounter (Signed)
VS  Marchelle Folks from [facility] needs you to make an addendum to your ov note to state pt's cpap compliance and that he is benefiting cpap therapy

## 2016-09-06 NOTE — Telephone Encounter (Signed)
Left message with rep for either amanda or mandy to call us back to relay VS message

## 2016-09-06 NOTE — Telephone Encounter (Signed)
Mandy returned call.  I read her the message from Dr. Craige Cotta and she states we can close the message.  If need to speak with her, CB is [phone].

## 2016-09-06 NOTE — Telephone Encounter (Signed)
I can't make comment like that at this time since he is having complications from CPAP therapy.  I am working to adjust his CPAP set up.  Will then reassess to determine his compliance and benefit from therapy.

## 2016-09-07 NOTE — Telephone Encounter (Signed)
VS ---  Pt is unable to get the new machine until the verbiage is added to the note.  The old machine is not working for the pt.  Joe Underwood even was trying to tweak his machine and try to put the machine on auto, but he has an old resmed escape 2 and these machines cannot be set to auto.  Joe Underwood is not able to help this pt further, unless he gets a new machine, and the pt does not want to pay out of pocket for a new machine. VS please advise.

## 2016-09-07 NOTE — Telephone Encounter (Signed)
Note changed

## 2016-09-07 NOTE — Telephone Encounter (Signed)
Called [facility] and spoke with Marchelle Folks and notified her that addendum was done  Nothing further needed

## 2016-11-28 ENCOUNTER — Encounter: Payer: Self-pay | Admitting: Pulmonary Disease

## 2016-11-28 ENCOUNTER — Ambulatory Visit (INDEPENDENT_AMBULATORY_CARE_PROVIDER_SITE_OTHER): Payer: Medicare Other | Admitting: Pulmonary Disease

## 2016-11-28 VITALS — BP 102/70 | HR 53 | Ht 66.0 in | Wt 226.4 lb

## 2016-11-28 DIAGNOSIS — G4733 Obstructive sleep apnea (adult) (pediatric): Secondary | ICD-10-CM

## 2016-11-28 NOTE — Patient Instructions (Signed)
Follow up in 1 year.

## 2016-11-28 NOTE — Progress Notes (Signed)
Current Outpatient Prescriptions on File Prior to Visit  Medication Sig  . albuterol (PROVENTIL HFA;VENTOLIN HFA) 108 (90 Base) MCG/ACT inhaler Inhale 1-2 puffs into the lungs every 6 (six) hours as needed for wheezing or shortness of breath.  . Azelastine-Fluticasone (DYMISTA) 137-50 MCG/ACT SUSP Place 2 sprays into the nose daily as needed (post nasal drip).   . famotidine (PEPCID) 20 MG tablet Take 20 mg by mouth 2 (two) times daily.  . fexofenadine (ALLEGRA) 180 MG tablet Take 180 mg by mouth at bedtime.   . metoprolol succinate (TOPROL-XL) 100 MG 24 hr tablet Take 100 mg by mouth.  . simvastatin (ZOCOR) 20 MG tablet Take 20 mg by mouth at bedtime.   Carlena Hurl. XARELTO 20 MG TABS tablet TK 1 T PO QPM AFTER DINNER   No current facility-administered medications on file prior to visit.      Chief Complaint  Patient presents with  . Follow-up    Wears CPAP nightly. Denies problems with mask/pressure. Pt is tolerating new mask very well. DME: [facility]     Sleep tests CPAP 10/29/16 to 11/27/16 >> used on 30 of 30 nights with average 9 hrs 56 min.  Average AHI 1.5 with median CPAP 10 and 95 th percentile CPAP 12 cm H2O  Cardiac tests Echo 05/24/16 >> EF 55 to 60%  Past medical history Asthma, Allergic rhinitis, GERD, HTN, HLD, CVA, PFO  Past surgical history, Family history, Social history, Allergies all reviewed  Vital Signs BP 102/70 (BP Location: Right Arm, Cuff Size: Normal)   Pulse (!) 53   Ht 5\' 6"  (1.676 m)   Wt 226 lb 6.4 oz (102.7 kg)   SpO2 96%   BMI 36.54 kg/m   History of Present Illness Joe Underwood is a 66 y.o. male with obstructive sleep apnea.  Since his last visit he received a new CPAP machine.  He is doing very well with this.  He feels like this is delivering pressure better.  He also changed to nasal cushion mask, and this is more comfortable.  He isn't getting as much air leak either.  He feels that new CPAP machine has actually improved his breathing  also.  Physical Exam  General - pleasant Eyes - pupils reactive ENT - no sinus tenderness, no oral exudate, no LAN Cardiac - regular, no murmur Chest - no wheeze, rales Abd - soft, non tender Ext - no edema Skin - no rashes Neuro - normal strength Psych - normal mood   Assessment/Plan  Obstructive sleep apnea. - he is compliant with CPAP and reports benefit from therapy - continue auto CPAP   Patient Instructions  Follow up in 1 year    Coralyn HellingVineet Kinisha Soper, MD [place] [facility]/Sleep Pager:  [phone] 11/28/2016, 9:17 AM

## 2017-01-08 ENCOUNTER — Encounter: Payer: Self-pay | Admitting: Neurology

## 2017-01-08 ENCOUNTER — Encounter (INDEPENDENT_AMBULATORY_CARE_PROVIDER_SITE_OTHER): Payer: Self-pay

## 2017-01-08 ENCOUNTER — Ambulatory Visit (INDEPENDENT_AMBULATORY_CARE_PROVIDER_SITE_OTHER): Payer: Medicare Other | Admitting: Neurology

## 2017-01-08 VITALS — BP 121/78 | HR 55 | Ht 66.0 in | Wt 224.0 lb

## 2017-01-08 DIAGNOSIS — Q211 Atrial septal defect: Secondary | ICD-10-CM

## 2017-01-08 DIAGNOSIS — Q2112 Patent foramen ovale: Secondary | ICD-10-CM

## 2017-01-08 NOTE — Patient Instructions (Signed)
I had a long d/w patient about his remotet stroke, endovascular PFO closure, atrial fibrillation,risk for recurrent stroke/TIAs, personally independently reviewed imaging studies and stroke evaluation results and answered questions.Continue Xarelto (rivaroxaban) daily  for secondary stroke prevention and maintain strict control of hypertension with blood pressure goal below 130/90, diabetes with hemoglobin A1c goal below 6.5% and lipids with LDL cholesterol goal below 70 mg/dL. I also advised the patient to eat a healthy diet with plenty of whole grains, cereals, fruits and vegetables, exercise regularly and maintain ideal body weight. No routine scheduled follow-up with me is necessary but he may return in the future only if needed.

## 2017-01-08 NOTE — Progress Notes (Signed)
[facility] [address] [place]. [place] [address] [phone]       OFFICE FOLLOW UP VISIT NOTE  Mr. Joe Joe Underwood Date of Birth:  08-13-1950 Medical Record Number:  [id number]   Referring MD:  Joe Gemma, PA-C  Reason for Referral:  stroke HPI: Initial visit 04/18/16; Mr Joe Underwood is a pleasant 63 year Caucasian male who while visiting Joe Joe Underwood in November 2017 had a small stroke. On 11/9 /2017 he was sitting on a computer when he noticed an onset of right hand weakness and difficulty holding a pen picking up the phone using his fingers. He was taken to Joe Joe Underwood where he was evaluated by tele-stroke. CT scan of the head was unremarkable. I have personally reviewed imaging disc has temperature of the CT scan which was normal. MRI images available a incomplete and did not show diffusion-weighted imaging but as per the report patient had a small left. Post central guyrus infarct. 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 MRA of the brain had fetal pattern of origin of the left posterior cerebral artery but no large vessel occlusion or stenosis. Transthoracic echo showed normal ejection fraction. LDL cholesterol was 69 mg percent. Carotid ultrasound showed no significant large vessel stenosis. Patient was seen by neurologist Joe Joe Underwood from [facility]. Telemetry monitoring did not reveal cardiac and mass. Patient has had outpatient heart monitor placed yesterday by cardiologist Joe Joe Underwood. Patient gives prior history of stroke in 2003 when he had some eye movement abnormalities and double vision. He was found to have a patent foramen ovale and that time. He is scheduled to undergo transesophageal echocardiogram  With Joe Joe Underwood  on 12/19 17. Patient has been on dual antiplatelet therapy of aspirin and Plavix since his initial stroke and this has been continued though he has no  definite long-term indications informed cardiac stents. Past medical history is fairly benign except mild hypertension and hypertriglyceridemia. Patient does not smoke or drink. He states is physically quite active and and states his right hand weakness improved completely within a few days and he has no residual deficits. He had no recurrent stroke or TIA symptoms. His starting aspirin and Plavix well without significant bruising or bleeding. He was seen in the emergency room on 06/30/16 with shortness of breath and a new rash. Improved with nebulizer treatment. He was asked to stop Tamiflu his rash also got better. Update 07/11/2016 ;  he returns for follow-up after last visit 2 and half months ago. He did undergo transcranial Doppler bubble study on 12/14-17 by Joe Joe Underwood which confirmed a moderate size right to left intracardiac shunt. He saw Joe. Jacinto Underwood and had endovascular Joe Underwood closure using Joe Underwood device on 05/23/16. The procedure went initially well but 2 days later the patient developed transient atrial fibrillation lasting about 6 hours. He has been started on Joe Underwood as well as metoprolol since then. He has remained in sinus rhythm. He is tolerating Joe Underwood well without bleeding or bruising. He is also on aspirin. He states his blood pressure is well controlled today in fact it is low at 99/73. He is tolerating Joe Underwood well without muscle aches or pains. He has no new complaints today. Update 01/08/2017 : He returns for follow-up after last visit 6 months ago. He continues to do well without recurrent stroke or TIA symptoms. He does state that he had an episode of possible atrial fibrillation few weeks ago  when he had mistaken the dose of metoprolol and was late in taking Joe Underwood. He is feeling much better now. He stopped aspirin after discussion with Joe.  Jacinto Underwood and is currently only on Joe Underwood which is tolerating well without bleeding or bruising. He states his lipids are well controlled and is tolerating Joe Underwood  without muscle aches or pains. His blood pressure is well controlled and today it is 121/70. He has not gained any weight but he has not lost weight either. He is particular about his eating and eats small portion sizes. He is quite active.He exercises regularly. He had follow-up transcranial Doppler bubble study done in April 2018 which showed successful complete endovascular closure of the Joe Underwood without any right-to-left shunt noted. He has no complaints today ROS:   14 system review of systems is positive for  No com and all other systems negative PMH:  Past Medical History:  Diagnosis Date  . Asthma   . GERD (gastroesophageal reflux disease)   . HTN (hypertension)   . Hyperlipidemia   . OSA on CPAP   . Stroke Joe Joe Underwood) 2003; 03/2016   denies any residual on 05/23/2016    Social History:  Social History   Social History  . Marital status: Married    Spouse name: N/A  . Number of children: N/A  . Years of education: N/A   Occupational History  . retired    Social History Main Topics  . Smoking status: Former Smoker    Packs/day: 1.00    Years: 13.00    Types: Cigarettes    Quit date: 06/11/1980  . Smokeless tobacco: Never Used  . Alcohol use 16.8 oz/week    28 Shots of liquor per week     Comment: 2, 2 shots of  liquor drinks ocassionally  . Drug use: No  . Sexual activity: Not on file   Other Topics Concern  . Not on file   Social History Narrative  . No narrative on file    Medications:   Current Outpatient Prescriptions on File Prior to Visit  Medication Sig Dispense Refill  . albuterol (PROVENTIL HFA;VENTOLIN HFA) 108 (90 Base) MCG/ACT inhaler Inhale 1-2 puffs into the lungs every 6 (six) hours as needed for wheezing or shortness of breath. 1 Inhaler 0  . Azelastine-Fluticasone (DYMISTA) 137-50 MCG/ACT SUSP Place 2 sprays into the nose daily as needed (post nasal drip).     . famotidine (PEPCID) 20 MG tablet Take 20 mg by mouth 2 (two) times daily.    . fexofenadine  (ALLEGRA) 180 MG tablet Take 180 mg by mouth at bedtime.     . metoprolol succinate (TOPROL-XL) 100 MG 24 hr tablet Take 100 mg by mouth.    . simvastatin (Joe Underwood) 20 MG tablet Take 20 mg by mouth at bedtime.      No current facility-administered medications on file prior to visit.     Allergies:   Allergies  Allergen Reactions  . Oseltamivir Rash    Physical Exam General: mildly obese middle aged Caucasian male, seated, in no evident distress Head: head normocephalic and atraumatic.   Neck: supple with no carotid or supraclavicular bruits Cardiovascular: regular rate and rhythm, no murmurs Musculoskeletal: no deformity Skin:  no rash/petichiae Vascular:  Normal pulses all extremities  Neurologic Exam Mental Status: Awake and fully alert. Oriented to place and time. Recent and remote memory intact. Attention span, concentration and fund of knowledge appropriate. Mood and affect appropriate.  Cranial Nerves: Fundoscopic exam not done Pupils  equal, briskly reactive to light. Extraocular movements full without nystagmus. Visual fields full to confrontation. Hearing intact. Facial sensation intact. Face, tongue, palate moves normally and symmetrically.  Motor: Normal bulk and tone. Normal strength in all tested extremity muscles. Sensory.: intact to touch , pinprick , position and vibratory sensation.  Coordination: Rapid alternating movements normal in all extremities. Finger-to-nose and heel-to-shin performed accurately bilaterally. Gait and Station: Arises from chair without difficulty. Stance is normal. Gait demonstrates normal stride length and balance . Able to heel, toe and tandem walk without difficulty.  Reflexes: 1+ and symmetric. Toes downgoing.       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 Known history of patent foramen ovale s/p endovascular closure January 2019.Marland Kitchen Remote brainstem infarct  in 2003    PLAN: I had a long d/w patient about his remote stroke, Joe Underwood and Joe Underwood closure, transient atrial fibrillation, risk for recurrent stroke/TIAs, personally independently reviewed imaging studies and stroke evaluation results and answered questions.Continue  Joe Underwood (rivaroxaban) daily  for secondary stroke prevention and maintain strict control of hypertension with blood pressure goal below 130/90, diabetes with hemoglobin A1c goal below 6.5% and lipids with LDL cholesterol goal below 70 mg/dL. I also advised the patient to eat a healthy diet with plenty of whole grains, cereals, fruits and vegetables, exercise regularly and maintain ideal body weight.  Greater than 50% time during this 25 minute visit was spent on counseling about Joe Underwood closure,atrial fibrillation stroke risk, prevention and treatment option discussion and answering questions. No routine scheduled follow-up appointment with me is necessary but he may be referred back in the future as needed only.  Delia Heady, MD  [facility] [address] [place], [place] [address]  Phone [phone] Fax [phone] Note: This document was prepared with digital dictation and possible smart phrase technology. Any transcriptional errors that result from this process are unintentional.

## 2017-07-03 IMAGING — CR DG CHEST 2V
2 series · 2 of 2 positions shown · non-contrast
Comparison: None.

CLINICAL DATA: Acute onset of shortness of breath and neck
swelling. Recently diagnosed with flu. Initial encounter.

EXAM:
CHEST  2 VIEW

[chest pa]
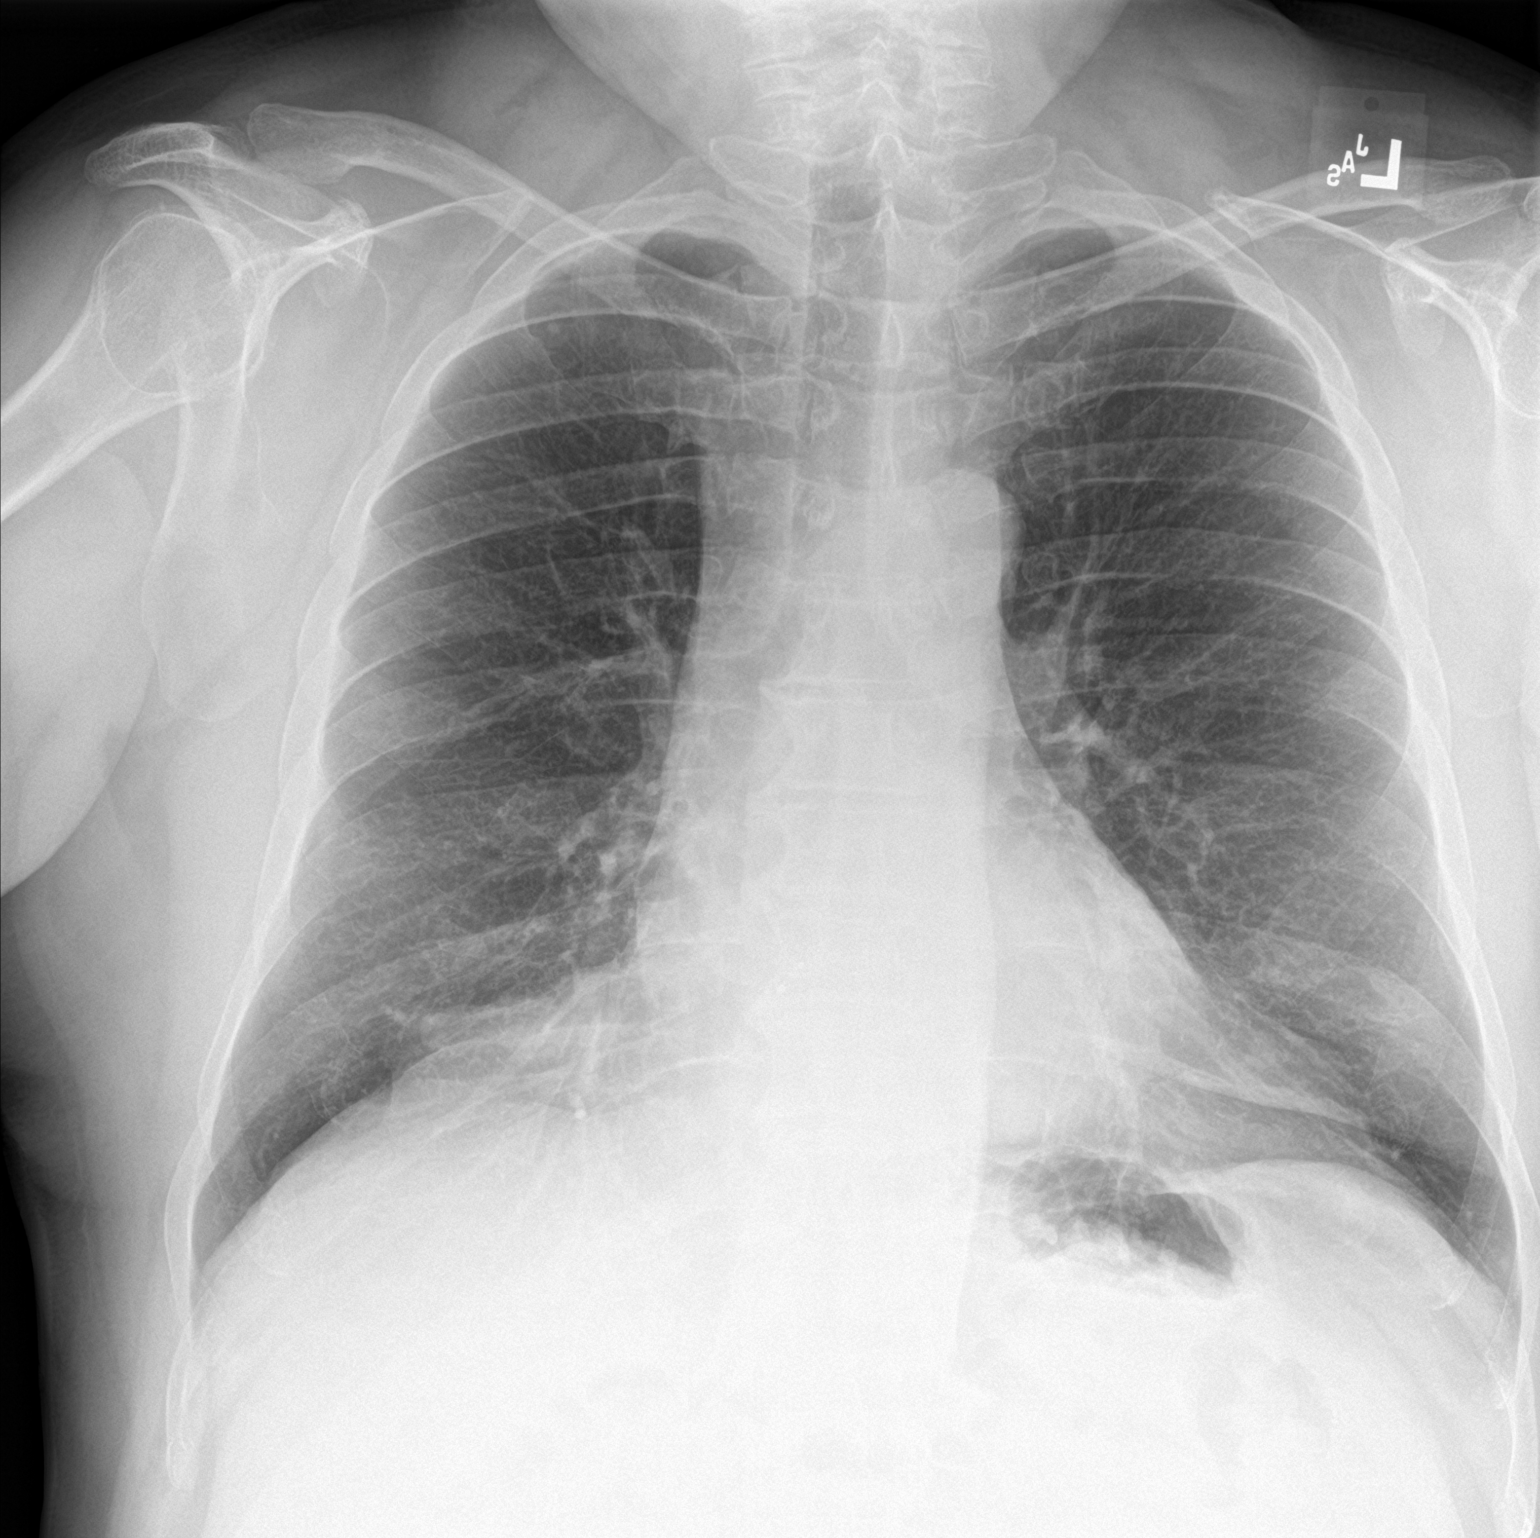

[chest lat]
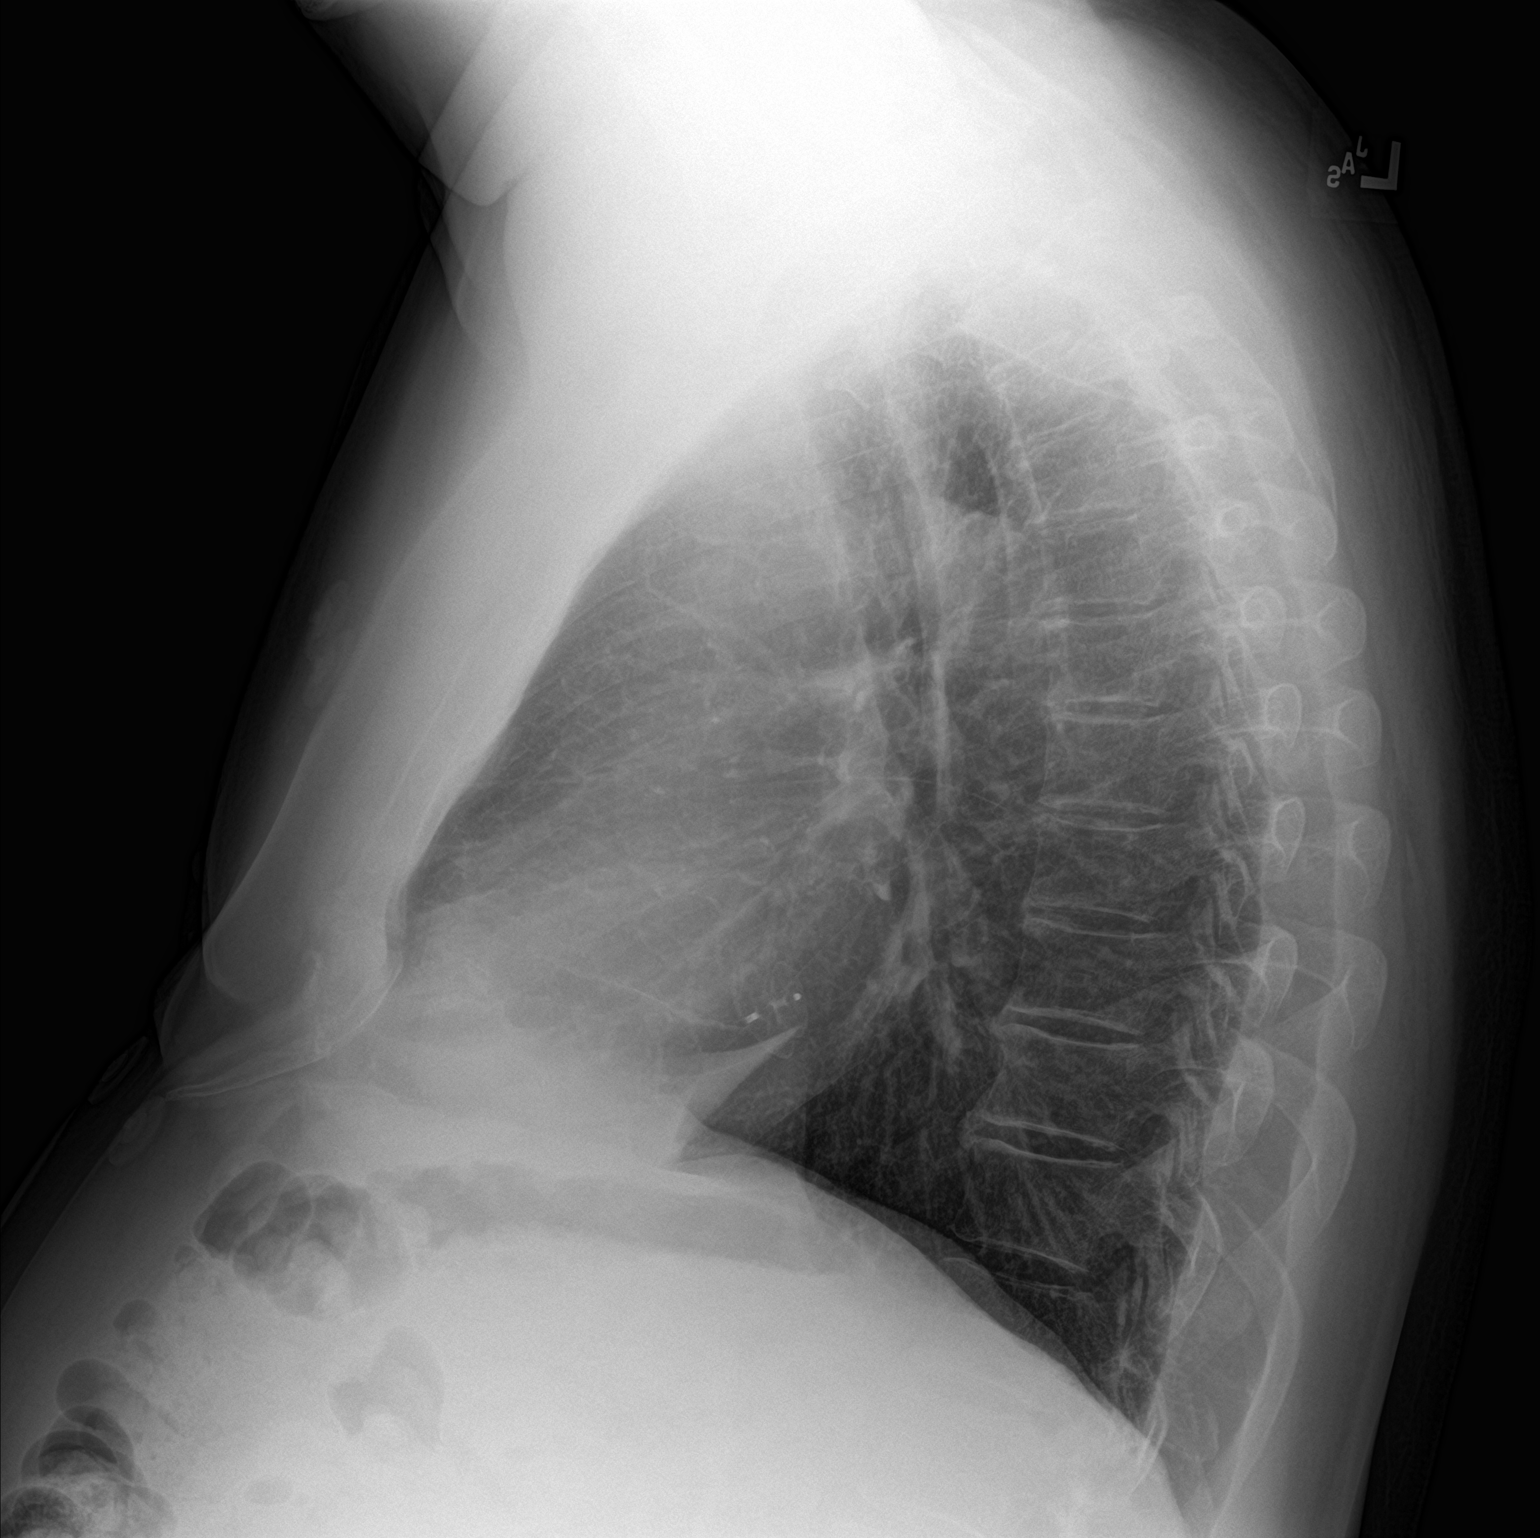

[2 of 2 positions shown; findings below may reference images not displayed]

FINDINGS: The lungs are well-aerated. Minimal bibasilar atelectasis is noted.
There is no evidence of pleural effusion or pneumothorax.

The heart is normal in size; the mediastinal contour is within
normal limits. No acute osseous abnormalities are seen.
IMPRESSION: Minimal bibasilar atelectasis noted.  Lungs otherwise clear.

## 2017-11-27 ENCOUNTER — Ambulatory Visit: Payer: Medicare Other | Admitting: Pulmonary Disease

## 2017-11-27 ENCOUNTER — Encounter: Payer: Self-pay | Admitting: Pulmonary Disease

## 2017-11-27 VITALS — BP 134/72 | HR 65 | Ht 66.0 in | Wt 226.0 lb

## 2017-11-27 DIAGNOSIS — Z9989 Dependence on other enabling machines and devices: Secondary | ICD-10-CM | POA: Diagnosis not present

## 2017-11-27 DIAGNOSIS — G4733 Obstructive sleep apnea (adult) (pediatric): Secondary | ICD-10-CM | POA: Diagnosis not present

## 2017-11-27 NOTE — Progress Notes (Signed)
[facility]  Chief Complaint  Patient presents with  . Follow-up    1 year rov OSA.  pt states that he is doing well on cpap, interested in a chin strap.     Constitutional: BP 134/72 (BP Location: Right Arm, Cuff Size: Normal)   Pulse 65   Ht 5\' 6"  (1.676 m)   Wt 226 lb (102.5 kg)   SpO2 96%   BMI 36.48 kg/m   History of Present Illness: Sue LushKenneth Bento is a 67 y.o. male with obstructive sleep apnea.  He has been doing much better with CPAP since he got new machine last year.  Uses nasal pillows.  Has to sleep with pillow under his neck when he naps during the day to prevent his mouth from opening.  Not an issue at night since he sleeps on his side.  Denies sinus congestion, mouth dryness, or sore throat.    Comprehensive Respiratory Exam:  Appearance - well kempt  ENMT - nasal mucosa moist, turbinates clear, midline nasal septum, no dental lesions, no gingival bleeding, no oral exudates, no tonsillar hypertrophy, MP 3 Neck - no masses, trachea midline, no thyromegaly, no elevation in JVP Respiratory - normal appearance of chest wall, normal respiratory effort w/o accessory muscle use, no dullness on percussion, no wheezing or rales CV - s1s2 regular rate and rhythm, no murmurs, no peripheral edema, no varicosities, radial pulses symmetric GI - soft, non tender Lymph - no adenopathy noted in neck and axillary areas MSK - normal muscle strength and tone, normal gait Ext - no cyanosis, clubbing, or joint inflammation noted Skin - no rashes, lesions, or ulcers Neuro - oriented to person, place, and time Psych - normal mood and affect   Assessment/Plan:  Obstructive sleep apnea. - he is compliant with CPAP and reports benefit - continue auto CPAP - will arrange for chin strap to use with nasal pillow CPAP mask   Patient Instructions  Will arrange for chin strap to use with CPAP mask  Follow up in 1 year    Coralyn HellingVineet Illias Pantano,  MD [facility] 11/27/2017, 3:43 PM  Flow Sheet  Sleep tests: Auto CPAP 10/29/17 to 11/27/17 >> used on 30 of 30 nights with average 9 hrs 50 min.  Average AHI 1.3 with median CPAP 11 and 95 th percentile CPAP 12 cm H2O  Cardiac tests Echo 05/24/16 >> EF 55 to 60%  Past Medical History: He  has a past medical history of Asthma, GERD (gastroesophageal reflux disease), HTN (hypertension), Hyperlipidemia, OSA on CPAP, and Stroke (HCC) (2003; 03/2016).  Past Surgical History: He  has a past surgical history that includes TEE without cardioversion (N/A, 05/02/2016); Patent foramen ovale closure (N/A, 05/23/2016); Hernia repair; and Abdominal hernia repair (1996).  Family History: His family history includes Heart attack in his father and mother; Heart disease in his brother, father, and mother; Stroke in his brother.  Social History: He  reports that he quit smoking about 37 years ago. His smoking use included cigarettes. He has a 13.00 pack-year smoking history. He has never used smokeless tobacco. He reports that he drinks about 16.8 oz of alcohol per week. He reports that he does not use drugs.  Medications: Allergies as of 11/27/2017      Reactions   Oseltamivir Rash      Medication List        Accurate as of 11/27/17  3:43 PM. Always use your most recent med list.  albuterol 108 (90 Base) MCG/ACT inhaler Commonly known as:  PROVENTIL HFA;VENTOLIN HFA Inhale 1-2 puffs into the lungs every 6 (six) hours as needed for wheezing or shortness of breath.   doxycycline 100 MG capsule Commonly known as:  VIBRAMYCIN Take 100 mg by mouth daily as needed.   DYMISTA 137-50 MCG/ACT Susp Generic drug:  Azelastine-Fluticasone Place 2 sprays into the nose daily as needed (post nasal drip).   famotidine 20 MG tablet Commonly known as:  PEPCID Take 20 mg by mouth 2 (two) times daily.   fexofenadine 180 MG tablet Commonly known as:  ALLEGRA Take 180 mg by mouth  at bedtime.   metoprolol succinate 50 MG 24 hr tablet Commonly known as:  TOPROL-XL Take 50 mg by mouth. Takes only when in aFib per pt   rivaroxaban 20 MG Tabs tablet Commonly known as:  XARELTO Take 20 mg by mouth.   simvastatin 20 MG tablet Commonly known as:  ZOCOR Take 20 mg by mouth at bedtime.   verapamil 180 MG 24 hr capsule Commonly known as:  VERELAN PM Take 180 mg by mouth at bedtime.

## 2017-11-27 NOTE — Patient Instructions (Signed)
Will arrange for chin strap to use with CPAP mask  Follow up in 1 year 

## 2018-04-16 ENCOUNTER — Telehealth: Payer: Self-pay | Admitting: Pulmonary Disease

## 2018-04-16 NOTE — Telephone Encounter (Signed)
ATC Andy from [facility], went straight to VM. LMTCB

## 2018-04-17 NOTE — Telephone Encounter (Signed)
Attempted to call Joe Underwood with [employer] but unable to reach him. Left message for Joe Underwood to return call.

## 2018-04-19 NOTE — Telephone Encounter (Signed)
Called [employer] trying to speak with Mardelle MatteAndy but reached ClarionGilda who stated that Mardelle Mattendy was currently with a pt. I relayed the message stated by Mardelle MatteAndy in regards to the message stated by Mardelle MatteAndy but when Rod HollerGilda looked at this and pt's chart she could not figure out what he was talking about.  Rod HollerGilda stated she would let Mardelle Mattendy know that we have been trying to reach him in regards to him calling us. Will await a return call from White StoneAndy.

## 2018-04-22 NOTE — Telephone Encounter (Signed)
ATC Mardelle Mattendy unable to reach left message.

## 2018-04-23 NOTE — Telephone Encounter (Signed)
Called [employer] and spoke with Mardelle Mattendy who states pt was added to care orchestrator instead of [facility].  Per Mardelle MatteAndy, Dr. Craige CottaSood and a few other providers had been added to care orchestrator and Mardelle Mattendy was wanting to know if we were able to see all pts that are assigned to us as he stated that there had been an issue with being able to see pts in care orchestrator.  I stated to Mardelle Mattendy that I would check to see if we are able to see pt in care orchestrator to be able to get a download from pt's cpap and if we had any issues doing so, I stated to him I would call him back.  Jess, since I am having difficulties with trying to log in to care orchestrator, can you please do me a favor to see if you are able to get in to see pt? That way we can let Mardelle Mattendy know if we have any problems or if all is good. Thanks!

## 2018-04-25 NOTE — Telephone Encounter (Signed)
I have went on care Orchestrator I have found the patient and was able to down load a report I will place this in doctor Soods look at folder. Nothing further needed at this time.

## 2018-11-21 ENCOUNTER — Other Ambulatory Visit: Payer: Self-pay

## 2018-11-21 ENCOUNTER — Ambulatory Visit (INDEPENDENT_AMBULATORY_CARE_PROVIDER_SITE_OTHER): Payer: Medicare Other | Admitting: Pulmonary Disease

## 2018-11-21 ENCOUNTER — Encounter: Payer: Self-pay | Admitting: Pulmonary Disease

## 2018-11-21 VITALS — BP 138/60 | HR 72 | Temp 98.1°F | Ht 66.0 in | Wt 240.8 lb

## 2018-11-21 DIAGNOSIS — G4733 Obstructive sleep apnea (adult) (pediatric): Secondary | ICD-10-CM

## 2018-11-21 DIAGNOSIS — Z9989 Dependence on other enabling machines and devices: Secondary | ICD-10-CM

## 2018-11-21 DIAGNOSIS — J310 Chronic rhinitis: Secondary | ICD-10-CM | POA: Diagnosis not present

## 2018-11-21 DIAGNOSIS — E669 Obesity, unspecified: Secondary | ICD-10-CM

## 2018-11-21 DIAGNOSIS — G4730 Sleep apnea, unspecified: Secondary | ICD-10-CM | POA: Diagnosis not present

## 2018-11-21 NOTE — Patient Instructions (Signed)
Follow up in 1 year.

## 2018-11-21 NOTE — Progress Notes (Signed)
[facility]  Chief Complaint  Patient presents with  . Follow-up    cpap working well, pressure was changed recently working well now    Constitutional:  BP 138/60 (BP Location: Right Arm, Cuff Size: Normal)   Pulse 72   Temp 98.1 F (36.7 C) (Oral)   Ht 5\' 6"  (1.676 m)   Wt 240 lb 12.8 oz (109.2 kg)   SpO2 95%   BMI 38.87 kg/m   Past Medical History:  Asthma, GERD, HTN, HLD, CVA, Allergies  Brief Summary:  Joe Underwood is a 68 y.o. male with obstructive sleep apnea.  He uses CPAP nightly.  Has nasal pillows.  Uses dymista prn for nasal congestion.  Not having dry mouth or sore throat.  Trying to work on his diet, but gained some weight when he was in lock down from [place].  Physical Exam:   Appearance - well kempt   ENMT - no sinus tenderness, no nasal discharge, no oral exudate  Neck - no masses, trachea midline, no thyromegaly, no elevation in JVP  Respiratory - normal appearance of chest wall, normal respiratory effort w/o accessory muscle use, no dullness on percussion, no wheezing or rales  CV - s1s2 regular rate and rhythm, no murmurs, no peripheral edema, radial pulses symmetric  GI - soft, non tender  Lymph - no adenopathy noted in neck and axillary areas  MSK - normal gait  Ext - no cyanosis, clubbing, or joint inflammation noted  Skin - no rashes, lesions, or ulcers  Neuro - normal strength, oriented x 3  Psych - normal mood and affect    Assessment/Plan:   Obstructive sleep apnea. - he is compliant with CPAP and reports benefit - continue auto CPAP  CPAP rhinitis. - prn dymista  Obesity. - reviewed options to assist with weight loss  Patient Instructions  Follow up in 1 year    Chesley Mires, MD [place] Pager: [phone] 11/21/2018, 11:52 AM  Flow Sheet    Sleep tests:  Auto CPAP 10/21/18 to 11/19/18 >> used on 30 of 30 nights with average 10 hrs 22 min.  Average  AHI 1.7 with median CPAP 10 and 95 th percentile CPAP 11 cm H2O  Cardiac tests:  Echo 05/24/16 >> EF 55 to 60%   Medications:   Allergies as of 11/21/2018      Reactions   Oseltamivir Rash      Medication List       Accurate as of November 21, 2018 11:52 AM. If you have any questions, ask your nurse or doctor.        STOP taking these medications   verapamil 180 MG 24 hr capsule Commonly known as: VERELAN PM Stopped by: Chesley Mires, MD     TAKE these medications   albuterol 108 (90 Base) MCG/ACT inhaler Commonly known as: VENTOLIN HFA Inhale 1-2 puffs into the lungs every 6 (six) hours as needed for wheezing or shortness of breath.   doxycycline 100 MG capsule Commonly known as: VIBRAMYCIN Take 100 mg by mouth daily as needed.   Dymista 137-50 MCG/ACT Susp Generic drug: Azelastine-Fluticasone Place 2 sprays into the nose daily as needed (post nasal drip).   famotidine 20 MG tablet Commonly known as: PEPCID Take 20 mg by mouth 2 (two) times daily.   fexofenadine 180 MG tablet Commonly known as: ALLEGRA Take 180 mg by mouth at bedtime.   metoprolol succinate 50 MG 24 hr tablet Commonly known as:  TOPROL-XL Take 100 mg by mouth. 100mg  daily   rivaroxaban 20 MG Tabs tablet Commonly known as: XARELTO Take 20 mg by mouth.   simvastatin 20 MG tablet Commonly known as: ZOCOR Take 20 mg by mouth at bedtime.       Past Surgical History:  He  has a past surgical history that includes TEE without cardioversion (N/A, 05/02/2016); Patent foramen ovale closure (N/A, 05/23/2016); Hernia repair; and Abdominal hernia repair (1996).  Family History:  His family history includes Heart attack in his father and mother; Heart disease in his brother, father, and mother; Stroke in his brother.  Social History:  He  reports that he quit smoking about 38 years ago. His smoking use included cigarettes. He has a 13.00 pack-year smoking history. He has never used smokeless tobacco. He  reports current alcohol use of about 28.0 standard drinks of alcohol per week. He reports that he does not use drugs.

## 2019-04-30 ENCOUNTER — Ambulatory Visit: Payer: Medicare Other | Admitting: Cardiology

## 2019-04-30 ENCOUNTER — Encounter: Payer: Self-pay | Admitting: Cardiology

## 2019-04-30 DIAGNOSIS — I480 Paroxysmal atrial fibrillation: Secondary | ICD-10-CM

## 2019-04-30 DIAGNOSIS — I10 Essential (primary) hypertension: Secondary | ICD-10-CM | POA: Insufficient documentation

## 2019-04-30 HISTORY — DX: Paroxysmal atrial fibrillation: I48.0

## 2019-04-30 NOTE — Assessment & Plan Note (Deleted)
CHA2DS2-VASc Score is 4 with yearly risk of stroke of 4 %(A, HTN, Stroke)

## 2019-04-30 NOTE — Progress Notes (Deleted)
Primary Physician/Referring:  [name], PA-C  Patient ID: Joe Underwood, male    DOB: 07-Apr-1951, 68 y.o.   MRN: [id number]  No chief complaint on file.  HPI:    Joe Underwood  is a 68 y.o. Caucasian male patient  with hypertension, hyperlipidemia, obstructive sleep apnea compliant with CPAP, CVA in 2003 and again in 2017, family history of early CAD, history of PFO closure by [place] with 43mm Amplatzer PFO occluder in Jan 2018, and paroxysmal atrial fibrillation also found 3 days post ASD repair in Jan 2018, however had palpitations when Metoprolol was discontinued (need for allergy shots) and patient thinks he had recurrence of AF in Sept 2019.   Patient is here on a two-month office visit and follow-up of hypertension and also paroxysmal atrial fibrillation. He continues to be very active with playing golf several times a week. Denies any shortness of breath or chest pain. Has not had any reoccurence of stroke like symptoms. Tolerating anticoagulation without any bleeding diathesis. ***  Past Medical History:  Diagnosis Date  . Asthma   . GERD (gastroesophageal reflux disease)   . HTN (hypertension)   . Hyperlipidemia   . OSA on CPAP   . Paroxysmal atrial fibrillation (HCC) 04/30/2019  . Stroke [facility]) 2003; 03/2016   denies any residual on 05/23/2016   Past Surgical History:  Procedure Laterality Date  . ABDOMINAL HERNIA REPAIR  1996  . HERNIA REPAIR    . PATENT FORAMEN OVALE CLOSURE N/A 05/23/2016   Procedure: Patent [place](PFO) Closure;  Surgeon: Yates Decamp, MD;  Location: [facility] INVASIVE CV LAB;  Service: Cardiovascular;  Laterality: N/A;  . TEE WITHOUT CARDIOVERSION N/A 05/02/2016   Procedure: TRANSESOPHAGEAL ECHOCARDIOGRAM (TEE);  Surgeon: Yates Decamp, MD;  Location: [facility] ENDOSCOPY;  Service: Cardiovascular;  Laterality: N/A;   Social History   Socioeconomic History  . Marital status: Married    Spouse name: Not on file  . Number of children: Not on file  . Years  of education: Not on file  . Highest education level: Not on file  Occupational History  . Occupation: retired  Tobacco Use  . Smoking status: Former Smoker    Packs/day: 1.00    Years: 13.00    Pack years: 13.00    Types: Cigarettes    Quit date: 06/11/1980    Years since quitting: 38.9  . Smokeless tobacco: Never Used  Substance and Sexual Activity  . Alcohol use: Yes    Alcohol/week: 28.0 standard drinks    Types: 28 Shots of liquor per week    Comment: 2, 2 shots of  liquor drinks ocassionally  . Drug use: No  . Sexual activity: Not on file  Other Topics Concern  . Not on file  Social History Narrative  . Not on file   Social Determinants of Health   Financial Resource Strain:   . Difficulty of Paying Living Expenses: Not on file  Food Insecurity:   . Worried About Programme researcher, broadcasting/film/video in the Last Year: Not on file  . Ran Out of Food in the Last Year: Not on file  Transportation Needs:   . Lack of Transportation (Medical): Not on file  . Lack of Transportation (Non-Medical): Not on file  Physical Activity:   . Days of Exercise per Week: Not on file  . Minutes of Exercise per Session: Not on file  Stress:   . Feeling of Stress : Not on file  Social Connections:   . Frequency of  Communication with Friends and Family: Not on file  . Frequency of Social Gatherings with Friends and Family: Not on file  . Attends Religious Services: Not on file  . Active Member of Clubs or Organizations: Not on file  . Attends Archivist Meetings: Not on file  . Marital Status: Not on file  Intimate Partner Violence:   . Fear of Current or Ex-Partner: Not on file  . Emotionally Abused: Not on file  . Physically Abused: Not on file  . Sexually Abused: Not on file   ROS  ***Review of Systems  Constitution: Negative for chills, decreased appetite, malaise/fatigue and weight gain.  Cardiovascular: Negative for dyspnea on exertion, leg swelling and syncope.  Endocrine:  Negative for cold intolerance.  Hematologic/Lymphatic: Does not bruise/bleed easily.  Musculoskeletal: Negative for joint swelling.  Gastrointestinal: Negative for abdominal pain, anorexia, change in bowel habit, hematochezia and melena.  Neurological: Negative for headaches and light-headedness.  Psychiatric/Behavioral: Negative for depression and substance abuse.  All other systems reviewed and are negative.  Objective  There were no vitals taken for this visit.  Vitals with BMI 11/21/2018 11/27/2017 01/08/2017  Height 5\' 6"  5\' 6"  5\' 6"   Weight 240 lbs 13 oz 226 lbs 224 lbs  BMI 38.88 67.12 45.80  Systolic 998 338 250  Diastolic 60 72 78  Pulse 72 65 55     ***Physical Exam  Constitutional:  He is well built and moderately obese in no acute distress.  HENT:  Head: Atraumatic.  Eyes: Conjunctivae are normal.  Neck: No JVD present. No thyromegaly present.  Cardiovascular: Normal rate, regular rhythm, normal heart sounds and intact distal pulses. Exam reveals no gallop.  No murmur heard. No leg edema, no JVD.  Pulmonary/Chest: Effort normal and breath sounds normal.  Abdominal: Soft. Bowel sounds are normal.  Obese  Musculoskeletal:        General: Normal range of motion.     Cervical back: Neck supple.  Neurological: He is alert.  Skin: Skin is warm and dry.  Psychiatric: He has a normal mood and affect.   Laboratory examination:   Lipid ProfileResulted: 11/25/2018 2:15 PM [facility] Component Name Value Ref Range  LDL Direct 62 <130 mg/dL  Total Cholesterol 127 25 - 199 MG/DL  Triglycerides 167 (H) 10 - 150 MG/DL  HDL Cholesterol 50 35 - 135 MG/DL  Total Chol / HDL Cholesterol 2.5 <4.5   Non-HDL Cholesterol 77  Comment:  TARGET: <(LDL-C TARGET + 30)MG/DL    Comprehensive Metabolic PanelResulted: 5/39/7673 2:15 PM [facility] Component Name Value Ref Range  Sodium 139 135 - 146 MMOL/L  Potassium 4.5 3.5 - 5.3 MMOL/L    Chloride 104 98 - 110 MMOL/L  CO2 27 23 - 30 MMOL/L  BUN 15 8 - 24 MG/DL  Glucose 100 (H) 70 - 99 MG/DL  Creatinine 1.13 0.5 - 1.5 MG/DL  Calcium 9.4 8.5 - 10.5 MG/DL  Total Protein 7.1 6 - 8.3 G/DL  Albumin  4.4 3.5 - 5 G/DL  Total Bilirubin 1.4 (H) 0.1 - 1.2 MG/DL  Alkaline Phosphatase 63 25 - 125 IU/L or U/L  AST (SGOT) 40 5 - 40 IU/L or U/L  ALT (SGPT) 49 5 - 50 IU/L or U/L  Anion Gap 8 4 - 14 MMOL/L  Est. GFR Non-African American 66  Comment: GFR estimated by CKD-EPI equations, reportable up to 90 ML/MIN/1.73 M*2 >=60 ML/MIN/1.73 M*2    CBC and Differential (11/25/2018  9:16 AM EDT) CBC and Differential (11/25/2018 9:16 AM EDT)  Component Value Ref Range Performed At Pathologist Signature  WBC 5.2 4.8 - 10.8 x 10*3/uL [facility]   RBC 4.93 4.70 - 6.10 x 10*6/uL [facility]   Hemoglobin 15.7 14.0 - 18.0 G/DL [facility]   Hematocrit 46.1 42.0 - 52.0 % [facility]   MCV 93.4 80.0 - 94.0 FL [facility]   MCH 31.8 (H) 27.0 - 31.0 PG [facility]   MCHC 34.0 33.0 - 37.0 G/DL [facility]   RDW 14.1 11.5 - 14.5 % [facility]   Platelets 174        Medications and allergies   Allergies  Allergen Reactions  . Oseltamivir Rash     Current Outpatient Medications  Medication Instructions  . albuterol (PROVENTIL HFA;VENTOLIN HFA) 108 (90 Base) MCG/ACT inhaler 1-2 puffs, Inhalation, Every 6 hours PRN  . Azelastine-Fluticasone (DYMISTA) 137-50 MCG/ACT SUSP 2 sprays, Nasal, Daily PRN  . doxycycline (VIBRAMYCIN) 100 mg, Oral, Daily PRN  . famotidine (PEPCID) 20 mg, Oral, 2 times daily  . fexofenadine (ALLEGRA) 180 mg, Oral, Daily at bedtime  . metoprolol  succinate (TOPROL-XL) 100 mg, Oral,  daily  . rivaroxaban (XARELTO) 20 mg, Oral  . simvastatin (ZOCOR) 20 mg, Oral, Daily at bedtime   Radiology:  No results found.  Cardiac Studies:   Treadmill exercise stress test 12/31/2015: Indications: Screening for CAD The resting electrocardiogram demonstrated normal sinus rhythm, normal resting conduction, no resting arrhythmias and normal rest repolarization.  The stress electrocardiogram  shows upsloping 2 mm ST depression which was back to baseline at <1 minute intor recovery without recurrence. This is at most equivocal for ischemia.   There were no significant arrhythmias. Patient exercised on Bruce protocol for  8:30 minutes and achieved  96% of Max Predicted HR (Target HR was >85% MPHR) and  10.16 METS. Stress symptoms included fatigue and dyspnea. Normal BP response.  Exercise capacity was  normal. Impression: The stress electrocardiogram  shows upsloping 2 mm ST depression which was back to baseline at <1 minute intor recovery without recurrence. This is at most equivocal for ischemia.   No significant arrhythmias. Normal BP response.  Low risk stress EKG. Clinical correlation recommended.  PFO closure 05/23/2016: with 25 mm Amplatzer PFO Occluder.  Echocardiogram 04/15/2018: Left ventricle cavity is normal in size. Mild concentric hypertrophy of the left ventricle. Normal global wall motion. Calculated EF 55%. Mild (Grade I) aortic regurgitation. Mild (Grade I) mitral regurgitation. Mild tricuspid regurgitation.  No evidence of pulmonary hypertension. PFO closure new since previous study in 2017.   Assessment     ICD-10-CM   1. Paroxysmal atrial fibrillation (HCC)  I48.0     ***  EKG 03/06/2018: Normal sinus rhythm at 68 bpm, normal axis.  No evidence of ischemia.  Low-voltage complexes.  EKG 05/26/2016: Atrial fibrillation with rapid ventricular response at the rate of 110 bpm, normal axis, low voltage complexes.  No evidence of  ischemia.  Normal QT interval.  Recommendations:  No orders of the defined types were placed in this encounter.   Pietro Bonura  is a 68 y.o. Caucasian male patient  with hypertension, hyperlipidemia, obstructive sleep apnea compliant with CPAP, CVA in 2003 and again in 2017, family  history of early CAD, history of PFO closure by us with 25mm Amplatzer PFO occluder in Jan 2018, and paroxysmal atrial fibrillation also found 3 days post ASD repair in Jan 2018, however had palpitations when Metoprolol was discontinued (need for allergy shots) and patient thinks he had recurrence of AF in Sept 2019.   Patient is here on a two-month office visit and follow-up of hypertension and also paroxysmal atrial fibrillation. He continues to be very active with playing golf several times a week. Denies any shortness of breath or chest pain. Has not had any reoccurence of stroke like symptoms. Tolerating anticoagulation without any bleeding diathesis. ***  Yates DecampJay Trevonn Hallum, MD, [facility] 04/30/2019, 7:00 AM [facility] Pager: [phone] Office: [phone]

## 2019-06-06 ENCOUNTER — Ambulatory Visit: Payer: Medicare PPO | Attending: Internal Medicine

## 2019-06-06 ENCOUNTER — Ambulatory Visit: Payer: Medicare Other

## 2019-06-06 DIAGNOSIS — Z23 Encounter for immunization: Secondary | ICD-10-CM | POA: Insufficient documentation

## 2019-06-06 NOTE — Progress Notes (Signed)
   Covid-19 Vaccination Clinic  Name:  Joe Underwood    MRN: [id number] DOB: [birth year]  06/06/2019  Joe Underwood was observed post Covid-19 immunization for 30 minutes based on pre-vaccination screening without incidence. He was provided with Vaccine Information Sheet and instruction to access the V-Safe system.   Joe Underwood was instructed to call 911 with any severe reactions post vaccine: Marland Kitchen Difficulty breathing  . Swelling of your face and throat  . A fast heartbeat  . A bad rash all over your body  . Dizziness and weakness    Immunizations Administered    Name Date Dose VIS Date Route   Pfizer COVID-19 Vaccine 06/06/2019  2:17 PM 0.3 mL 04/25/2019 Intramuscular   Manufacturer: [employer], [employer]   Lot: [id number]   NDC: [address]-8

## 2019-06-11 ENCOUNTER — Ambulatory Visit (INDEPENDENT_AMBULATORY_CARE_PROVIDER_SITE_OTHER): Payer: Medicare PPO | Admitting: Cardiology

## 2019-06-11 ENCOUNTER — Encounter: Payer: Self-pay | Admitting: Cardiology

## 2019-06-11 ENCOUNTER — Other Ambulatory Visit: Payer: Self-pay

## 2019-06-11 VITALS — BP 140/85 | HR 55 | Temp 97.2°F | Ht 66.0 in | Wt 233.4 lb

## 2019-06-11 DIAGNOSIS — E782 Mixed hyperlipidemia: Secondary | ICD-10-CM

## 2019-06-11 DIAGNOSIS — G4733 Obstructive sleep apnea (adult) (pediatric): Secondary | ICD-10-CM | POA: Diagnosis not present

## 2019-06-11 DIAGNOSIS — I480 Paroxysmal atrial fibrillation: Secondary | ICD-10-CM | POA: Diagnosis not present

## 2019-06-11 DIAGNOSIS — I10 Essential (primary) hypertension: Secondary | ICD-10-CM

## 2019-06-11 NOTE — Progress Notes (Signed)
Primary Physician/Referring:  Joe Underwood., PA-C  Patient ID: Joe Underwood, male    DOB: [birth year], 69 y.o.   MRN: [id number]  Chief Complaint  Patient presents with  . Atrial Fibrillation  . Follow-up    1 yr   HPI:    Joe Underwood  is a 69 y.o. Caucasian male patient with  hypertension, hyperlipidemia, obstructive sleep apnea compliant with CPAP, CVA in 2003 and again in 2017, family history of early CAD, history of PFO closure by [place] with 75mm Amplatzer PFO occluder in Jan 2018, and paroxysmal atrial fibrillation also found in Jan 2018 a few days after ASD repair. Due to patient preference, he was continued on anticoagulation.  He is here on annual visit, denies chest pain or shortness of breath, states that he continues to play golf at least 2-3 times a week and has not noticed any dyspnea, chest pain or palpitations.  No bleeding diathesis on Eliquis.  Past Medical History:  Diagnosis Date  . Asthma   . Essential hypertension   . GERD (gastroesophageal reflux disease)   . HTN (hypertension)   . Hyperlipidemia   . OSA on CPAP   . Paroxysmal atrial fibrillation (HCC) 04/30/2019  . Stroke [facility]) 2003; 03/2016   denies any residual on 05/23/2016   Past Surgical History:  Procedure Laterality Date  . ABDOMINAL HERNIA REPAIR  1996  . HERNIA REPAIR    . PATENT FORAMEN OVALE CLOSURE N/A 05/23/2016   Procedure: Patent Forament Ovale(PFO) Closure;  Surgeon: Yates Decamp, MD;  Location: [facility];  Service: Cardiovascular;  Laterality: N/A;  . TEE WITHOUT CARDIOVERSION N/A 05/02/2016   Procedure: TRANSESOPHAGEAL ECHOCARDIOGRAM (TEE);  Surgeon: Yates Decamp, MD;  Location: [facility] ENDOSCOPY;  Service: Cardiovascular;  Laterality: N/A;   Social History   Tobacco Use  . Smoking status: Former Smoker    Packs/day: 1.00    Years: 13.00    Pack years: 13.00    Types: Cigarettes    Quit date: 06/11/1980    Years since quitting: 39.0  . Smokeless tobacco: Never Used  Substance  Use Topics  . Alcohol use: Yes    Comment: 2, 2 shots of  liquor drinks ocassionally    ROS  Review of Systems  Constitution: Negative for weight gain.  Cardiovascular: Negative for dyspnea on exertion, leg swelling and syncope.  Respiratory: Negative for hemoptysis.   Endocrine: Negative for cold intolerance.  Hematologic/Lymphatic: Does not bruise/bleed easily.  Gastrointestinal: Negative for hematochezia and melena.  Neurological: Negative for headaches and light-headedness.   Objective  Blood pressure 140/85, pulse (!) 55, temperature (!) 97.2 F (36.2 C), height 5\' 6"  (1.676 m), weight 233 lb 6.4 oz (105.9 kg), SpO2 96 %.  Vitals with BMI 06/11/2019 11/21/2018 11/27/2017  Height 5\' 6"  5\' 6"  5\' 6"   Weight 233 lbs 6 oz 240 lbs 13 oz 226 lbs  BMI 37.69 38.88 36.49  Systolic 140 138 11/29/2017  Diastolic 85 60 72  Pulse 55 72 65     Physical Exam  Constitutional:  Moderately obese  Neck: No thyromegaly present.  Cardiovascular: Normal rate, regular rhythm, normal heart sounds and intact distal pulses. Exam reveals no gallop.  No murmur heard. No leg edema, no JVD.  Pulmonary/Chest: Effort normal and breath sounds normal.  Abdominal: Soft. Bowel sounds are normal.  Musculoskeletal:     Cervical back: Neck supple.  Skin: Skin is warm and dry.   Laboratory examination:   No results for input(s): NA, K,  CL, CO2, GLUCOSE, BUN, CREATININE, CALCIUM, GFRNONAA, GFRAA in the last 8760 hours. CrCl cannot be calculated (Patient's most recent lab result is older than the maximum 21 days allowed.).  CMP Latest Ref Rng & Units 06/30/2016  Glucose 65 - 99 mg/dL 86  BUN 6 - 20 mg/dL 15  Creatinine 3.15 - 1.76 mg/dL 1.60  Sodium 737 - 106 mmol/L 142  Potassium 3.5 - 5.1 mmol/L 4.1  Chloride 101 - 111 mmol/L 106  CO2 22 - 32 mmol/L 22  Calcium 8.9 - 10.3 mg/dL 9.0   CBC Latest Ref Rng & Units 06/30/2016  WBC 4.0 - 10.5 K/uL 5.1  Hemoglobin 13.0 - 17.0 g/dL 26.9  Hematocrit 48.5 - 52.0 % 45.3   Platelets 150 - 400 K/uL 156   Lipid Panel  No results found for: CHOL, TRIG, HDL, CHOLHDL, VLDL, LDLCALC, LDLDIRECT HEMOGLOBIN A1C No results found for: HGBA1C, MPG TSH No results for input(s): TSH in the last 8760 hours.  External labs:  Care Everywhere Result Report Lipid ProfileResulted: 11/25/2018 2:15 PM [facility] Component Name Value Ref Range LDL Direct 62 <130 mg/dL Total Cholesterol 462 25 - 199 MG/DL Triglycerides 703 (H) 10 - 150 MG/DL  HDL Cholesterol 50 35 - 135 MG/DL Total Chol / HDL Cholesterol 2.5 <4.5  Non-HDL Cholesterol 77   RBC 4.93 4.70 - 6.10 x 10*6/uL [facility]  Hemoglobin 15.7 14.0 - 18.0 G/DL [facility]  Hematocrit 46.1 42.0 - 52.0 % [facility]  MCV 93.4 80.0 - 94.0 FL [facility]  MCH 31.8 (H) 27.0 - 31.0 PG [facility]  MCHC 34.0 33.0 - 37.0 G/DL [facility]  RDW 14.1 11.5 - 14.5 % [facility]  Platelets 174 160 - 360 X 10*    Comprehensive Metabolic Panel (11/25/2018 9:16 AM EDT) Component Value Ref Range Performed At Pathologist Signature Sodium 139 135 - 146 MMOL/L [facility]  Potassium 4.5 3.5 - 5.3 MMOL/L [facility]  Chloride 104 98 - 110 MMOL/L [facility]  CO2 27 23 - 30 MMOL/L [facility]  BUN 15 8 - 24 MG/DL [facility]  Glucose 100 (H) 70 - 99 MG/DL [facility]  Creatinine 1.13     Medications and allergies   Allergies  Allergen Reactions  . Oseltamivir Rash    Current Outpatient  Medications  Medication Instructions  . albuterol (PROVENTIL HFA;VENTOLIN HFA) 108 (90 Base) MCG/ACT inhaler 1-2 puffs, Inhalation, Every 6 hours PRN  . Azelastine-Fluticasone (DYMISTA) 137-50 MCG/ACT SUSP 2 sprays, Nasal, Daily PRN  . famotidine (PEPCID) 20 mg, Oral, 2 times daily  . fexofenadine (ALLEGRA) 180 mg, Oral, Daily at bedtime  . metoprolol succinate (TOPROL-XL) 100 MG 24 hr tablet 1 tablet, Oral, Daily  . rivaroxaban (XARELTO) 20 mg, Oral  . simvastatin (ZOCOR) 20 mg, Oral, Daily at bedtime    Radiology:  No results found.  Cardiac Studies:   Echocardiogram 04/15/2018: Left ventricle cavity is normal in size. Mild concentric hypertrophy of the left ventricle. Normal global wall motion. Calculated EF 55%. Mild (Grade I) aortic regurgitation. Mild (Grade I) mitral regurgitation. Mild tricuspid regurgitation.  No evidence of pulmonary hypertension. PFO closure new since previous study in 2017.   PFO closure 05/23/2016: with 25 mm Amplatzer PFO Occluder.  Assessment     ICD-10-CM   1. Paroxysmal atrial fibrillation (HCC) CHA2DS2-VASc Score is 4 with yearly risk of stroke of 4 %(A, HTN, Stroke)  I48.0 EKG 12-Lead  2. Essential hypertension  I10   3. OSA (obstructive sleep apnea)  G47.33   4. Mixed hyperlipidemia  E78.2     EKG 0/27/2021: Sinus bradycardia at rate of 54 bpm, normal axis, nonspecific T abnormality.  Normal QT interval.  Low-voltage complexes.      EKG 05/26/2016: Atrial fibrillation with rapid ventricular response at the rate of 110 bpm, normal axis, low voltage complexes.  No evidence of ischemia.  Normal QT interval.  No orders of the defined types were placed in this encounter.   Medications Discontinued During This Encounter  Medication Reason  . doxycycline (VIBRAMYCIN) 100 MG capsule Error  . metoprolol succinate (TOPROL-XL) 50 MG 24 hr tablet Error  . fexofenadine (ALLEGRA) 180 MG tablet Entry Error    Recommendations:   Joe Underwood  is  a 69 y.o. Caucasian male patient with  hypertension, hyperlipidemia, obstructive sleep apnea compliant with CPAP, CVA in 2003 and again in 2017, family history of early CAD, history of PFO closure by [place] with 30mm Amplatzer PFO occluder in Jan 2018, and paroxysmal atrial fibrillation also found in Jan 2018 a few days after ASD repair. Due to patient preference, he was continued on anticoagulation.  He is here on annual visit, he has not had any recurrence of atrial fibrillation but due to his preference we will continue anticoagulation.  Suspect his atrial fibrillation was probably related to the device after we closed the PFO.  However he feels much safer to be on anticoagulation.  His blood pressure was slightly elevated today at 140/84 mmHg.  Blood pressure at home recordings have been excellent.  He has an appointment to see Ms. Deatra Ina, PA-C in 1 week to 10 days, he will take his blood pressure cuff with him to see if there is a good correlation.  If not he would benefit from being on an ACE inhibitor or an ARB.  I reviewed his labs, triglycerides are slightly elevated however he has lost 10 pounds in weight, expect improvement in triglycerides with weight loss and making dietary changes.  He has been compliant with his CPAP.  No changes were done by me, I will see him back on an annual basis.  Adrian Prows, MD, [facility] 06/11/2019, 4:27 PM [facility] [place] Office: [phone]

## 2019-06-27 ENCOUNTER — Ambulatory Visit: Payer: Medicare PPO | Attending: Internal Medicine

## 2019-06-27 DIAGNOSIS — Z23 Encounter for immunization: Secondary | ICD-10-CM

## 2019-06-27 NOTE — Progress Notes (Signed)
   Covid-19 Vaccination Clinic  Name:  Joe Underwood    MRN: [id number] DOB: 07/31/1950  06/27/2019  [name] was observed post Covid-19 immunization for 15 minutes without incidence. He was provided with Vaccine Information Sheet and instruction to access the V-Safe system.   [name] was instructed to call 911 with any severe reactions post vaccine: Marland Kitchen Difficulty breathing  . Swelling of your face and throat  . A fast heartbeat  . A bad rash all over your body  . Dizziness and weakness    Immunizations Administered    Name Date Dose VIS Date Route   Pfizer COVID-19 Vaccine 06/27/2019 10:34 AM 0.3 mL 04/25/2019 Intramuscular   Manufacturer: [employer], [employer]   Lot: [id number]   NDC: [address]-6

## 2019-12-03 ENCOUNTER — Other Ambulatory Visit: Payer: Self-pay

## 2019-12-03 ENCOUNTER — Encounter: Payer: Self-pay | Admitting: Pulmonary Disease

## 2019-12-03 ENCOUNTER — Ambulatory Visit: Payer: Medicare PPO | Admitting: Pulmonary Disease

## 2019-12-03 VITALS — BP 130/74 | HR 50 | Temp 98.1°F | Ht 66.0 in | Wt 236.4 lb

## 2019-12-03 DIAGNOSIS — G4733 Obstructive sleep apnea (adult) (pediatric): Secondary | ICD-10-CM | POA: Diagnosis not present

## 2019-12-03 DIAGNOSIS — G4730 Sleep apnea, unspecified: Secondary | ICD-10-CM | POA: Diagnosis not present

## 2019-12-03 DIAGNOSIS — F458 Other somatoform disorders: Secondary | ICD-10-CM | POA: Diagnosis not present

## 2019-12-03 DIAGNOSIS — Z9989 Dependence on other enabling machines and devices: Secondary | ICD-10-CM | POA: Diagnosis not present

## 2019-12-03 DIAGNOSIS — E669 Obesity, unspecified: Secondary | ICD-10-CM

## 2019-12-03 NOTE — Progress Notes (Signed)
[place] [facility]  Chief Complaint  Patient presents with   Follow-up    sleep apnea    Constitutional:  BP 130/74 (BP Location: Left Arm, Cuff Size: Normal)    Pulse (!) 50    Temp 98.1 F (36.7 C) (Oral)    Ht 5\' 6"  (1.676 m)    Wt 236 lb 6.4 oz (107.2 kg)    SpO2 96% Comment: RA   BMI 38.16 kg/m   Past Medical History:  Asthma, GERD, HTN, HLD, CVA, Allergies  Brief Summary:  Joe Underwood is a 69 y.o. male with obstructive sleep apnea.  Subjective:   He has been getting episodes of bloating at night.  This wakes him up and then he feels like he can't take in a deep breath.  He belches several times and then feels okay.  Physical Exam:   Appearance - well kempt   ENMT - no sinus tenderness, no oral exudate, no LAN, Mallampati 3 airway, no stridor  Respiratory - equal breath sounds bilaterally, no wheezing or rales  CV - s1s2 regular rate and rhythm, no murmurs  Ext - no clubbing, no edema  Skin - no rashes  Psych - normal mood and affect   Assessment/Plan:   Obstructive sleep apnea. - he is compliant with CPAP - uses [employer] for his DME - he appears to be having aerophagia from CPAP - will try adjusting his auto CPAP setting to 8 - 10 cm H2O - he can try OTC simethicone  Obesity. - he understands the importance of weight loss  A total of  21 minutes spent addressing patient care issues on day of visit.   Follow up:    Patient Instructions  Will adjust your CPAP setting  Email update on status 2 weeks after pressure changed  Can try simethicone to help with bloating; you can get this over the counter without a prescription  Follow up in 1 year   Signature:  66, MD  Pulmonary/Critical Care Pager: [phone] 12/03/2019, 9:33 AM  Flow Sheet    Sleep tests:   Auto CPAP 11/02/19 to 12/01/19 >> used on 30 of 30 nights with average 9 hrs 39 min.  Average AHI 2 with median CPAP 10 and 95 th  percentile CPAP 11 cm H2O.  Cardiac tests:   Echo 05/24/16 >> EF 55 to 60%  Medications:   Allergies as of 12/03/2019      Reactions   Oseltamivir Rash      Medication List       Accurate as of December 03, 2019  9:33 AM. If you have any questions, ask your nurse or doctor.        STOP taking these medications   Dymista 137-50 MCG/ACT Susp Generic drug: Azelastine-Fluticasone Stopped by: December 05, 2019, MD     TAKE these medications   albuterol 108 (90 Base) MCG/ACT inhaler Commonly known as: VENTOLIN HFA Inhale 1-2 puffs into the lungs every 6 (six) hours as needed for wheezing or shortness of breath.   famotidine 20 MG tablet Commonly known as: PEPCID Take 20 mg by mouth 2 (two) times daily.   fexofenadine 180 MG tablet Commonly known as: ALLEGRA Take 180 mg by mouth at bedtime.   metoprolol succinate 100 MG 24 hr tablet Commonly known as: TOPROL-XL Take 1 tablet by mouth daily.   rivaroxaban 20 MG Tabs tablet Commonly known as: XARELTO Take 20 mg by mouth.   simvastatin 20 MG tablet Commonly  known as: ZOCOR Take 20 mg by mouth daily. What changed: Another medication with the same name was removed. Continue taking this medication, and follow the directions you see here. Changed by: Coralyn Helling, MD   sodium chloride 0.65 % Soln nasal spray Commonly known as: OCEAN Place 1 spray into both nostrils as needed for congestion.       Past Surgical History:  He  has a past surgical history that includes TEE without cardioversion (N/A, 05/02/2016); Patent foramen ovale closure (N/A, 05/23/2016); Hernia repair; and Abdominal hernia repair (1996).  Family History:  His family history includes Heart attack in his father and mother; Heart disease in his brother, father, and mother; Stroke in his brother.  Social History:  He  reports that he quit smoking about 39 years ago. His smoking use included cigarettes. He has a 13.00 pack-year smoking history. He has never used  smokeless tobacco. He reports current alcohol use. He reports that he does not use drugs.

## 2019-12-03 NOTE — Patient Instructions (Signed)
Will adjust your CPAP setting  Email update on status 2 weeks after pressure changed  Can try simethicone to help with bloating; you can get this over the counter without a prescription  Follow up in 1 year

## 2019-12-24 ENCOUNTER — Telehealth: Payer: Self-pay | Admitting: Pulmonary Disease

## 2019-12-24 NOTE — Telephone Encounter (Signed)
Spoke with the pt  He states that he was supposed to call Dr Craige Cotta to let him know how he felt with CPAP setting change  He states to let him know that he has been sick with a cold for and having congestion for the past 2 wks and unable to tell  He states once he improves, he will take note of how he is feeling for a few wks and then call with update  Forwarding to Dr Craige Cotta to make him aware

## 2019-12-24 NOTE — Telephone Encounter (Signed)
Noted.  Coralyn Helling, MD [facility] Pulmonary/Critical Care Pager - [phone] 12/24/2019, 5:50 PM

## 2020-06-10 ENCOUNTER — Ambulatory Visit: Payer: Medicare PPO | Admitting: Cardiology

## 2020-07-08 ENCOUNTER — Ambulatory Visit: Payer: Medicare PPO | Admitting: Cardiology

## 2020-07-08 ENCOUNTER — Encounter: Payer: Self-pay | Admitting: Cardiology

## 2020-07-08 ENCOUNTER — Other Ambulatory Visit: Payer: Self-pay

## 2020-07-08 VITALS — BP 134/84 | HR 59 | Temp 97.7°F | Resp 17 | Ht 66.0 in | Wt 234.4 lb

## 2020-07-08 DIAGNOSIS — I480 Paroxysmal atrial fibrillation: Secondary | ICD-10-CM

## 2020-07-08 DIAGNOSIS — G4733 Obstructive sleep apnea (adult) (pediatric): Secondary | ICD-10-CM

## 2020-07-08 DIAGNOSIS — I10 Essential (primary) hypertension: Secondary | ICD-10-CM

## 2020-07-08 DIAGNOSIS — E782 Mixed hyperlipidemia: Secondary | ICD-10-CM

## 2020-07-08 MED ORDER — ROSUVASTATIN CALCIUM 10 MG PO TABS: 10.0000 mg | ORAL_TABLET | Freq: Every day | ORAL | 3 refills | Status: AC

## 2020-07-08 NOTE — Progress Notes (Signed)
Primary Physician/Referring:  [name], PA-C  Patient ID: Joe Underwood, male    DOB: 1951/03/07, 70 y.o.   MRN: [id number]  No chief complaint on file.  HPI:    Joe Underwood  is a 70 y.o. Caucasian male patient with  hypertension, hyperlipidemia, obstructive sleep apnea compliant with CPAP, CVA in 2003 and again in 2017, family history of early CAD, history of PFO closure by [place] with 40m Amplatzer PFO occluder in Jan 2018, and paroxysmal atrial fibrillation also found in Jan 2018 a few days after ASD repair. Due to patient preference, he was continued on anticoagulation.  He is here on annual visit, denies chest pain or shortness of breath, states that he continues to play golf at least 2-3 times a week and has not noticed any dyspnea, chest pain or palpitations.  No bleeding diathesis on Eliquis.  Past Medical History:  Diagnosis Date  . Asthma   . Essential hypertension   . GERD (gastroesophageal reflux disease)   . HTN (hypertension)   . Hyperlipidemia   . OSA on CPAP   . [place]ox[place] 04/30[place]2019  . Stroke ([facility] 2003; 03/2016   denies any residual on 05/23/2016   Past Surgical History:  Procedure Laterality Date  . ABDOMINAL HERNIA REPAIR  1996  . HERNIA REPAIR    . PATENT FORAMEN OVALE CLOSURE N/A 05/23/2016   Procedure: Patent Forament Ovale(PFO) Closure;  Surgeon: JAdrian Prows MD;  [facility];  Service: Cardiovascular;  Laterality: N/A;  . TEE WITHOUT CARDIOVERSION N/A 05/02/2016   Procedure: TRANSESOPHAGEAL ECHOCARDIOGRAM (TEE);  Surgeon: JAdrian Prows MD;  [facility];  Service: Cardiovascular;  Laterality: N/A;   Social History   Tobacco Use  . Smoking status: Former Smoker    Packs/day: 1.00    Years: 13.00    Pack years: 13.00    Types: Cigarettes    Quit date: 06/11/1980    Years since quitting: 40.1  . Smokeless tobacco: Never Used  Substance Use Topics  . Alcohol use: Yes    Comment: 2, 2 shots of   liquor drinks ocassionally   ROS  Review of Systems  Cardiovascular: Negative for chest pain, dyspnea on exertion and leg swelling.  Gastrointestinal: Negative for melena.   Objective  Blood pressure 134/84, pulse (!) 59, temperature 97.7 F (36.5 C), temperature source Temporal, resp. rate 17, height _0  (1.676 m), weight 234 lb 6.4 oz (106.3 kg), SpO2 98 %.  Vitals with BMI 07/08/2020 12/03/2019 06/11/2019  Height _1  _2  _3   Weight 234 lbs 6 oz 236 lbs 6 oz 233 lbs 6 oz  BMI 37.85 336.62394.76 Systolic [id number] Diastolic 84 74 85  Pulse 59 50 55     Physical Exam Constitutional:      Comments: Moderately obese  Neck:     Thyroid: No thyromegaly.  Cardiovascular:     Rate and Rhythm: Normal rate and regular rhythm.     Pulses: Intact distal pulses.     Heart sounds: Normal heart sounds. No murmur heard. No gallop.      Comments: No leg edema, no JVD. Pulmonary:     Effort: Pulmonary effort is normal.     Breath sounds: Normal breath sounds.  Abdominal:     General: Bowel sounds are normal.     Palpations: Abdomen is soft.  Musculoskeletal:     Cervical back: Neck supple.  Skin:    General: Skin  is warm and dry.    Laboratory examination:   No results for input(s): NA, K, CL, CO2, GLUCOSE, BUN, CREATININE, CALCIUM, GFRNONAA, GFRAA in the last 8760 hours. CrCl cannot be calculated (Patient's most recent lab result is older than the maximum 21 days allowed.).  CMP Latest Ref Rng & Units 06/30/2016  Glucose 65 - 99 mg/dL 86  BUN 6 - 20 mg/dL 15  Creatinine 0.61 - 1.24 mg/dL 1.05  Sodium 135 - 145 mmol/L 142  Potassium 3.5 - 5.1 mmol/L 4.1  Chloride 101 - 111 mmol/L 106  CO2 22 - 32 mmol/L 22  Calcium 8.9 - 10.3 mg/dL 9.0   CBC Latest Ref Rng & Units 06/30/2016  WBC 4.0 - 10.5 K/uL 5.1  Hemoglobin 13.0 - 17.0 g/dL 15.3  Hematocrit 39.0 - 52.0 % 45.3  Platelets 150 - 400 K/uL 156   Lipid Panel  No results found for: CHOL, TRIG, HDL, CHOLHDL, VLDL,  LDLCALC, LDLDIRECT HEMOGLOBIN A1C No results found for: HGBA1C, MPG TSH No results for input(s): TSH in the last 8760 hours.  External labs:   Labs 06/14/2020:  Sodium 140, potassium 4.7, BUN 18, creatinine 1.08, EGFR >60 mL, bilirubin 1.5.  LFTs otherwise normal.  Total cholesterol 133, triglycerides 169, HDL 51, LDL direct 68.  Hb 15.3/HCT 44.6, platelets 164.  Medications and allergies   Allergies  Allergen Reactions  . Oseltamivir Rash    Current Outpatient Medications  Medication Instructions  . famotidine (PEPCID) 20 mg, Oral, 2 times daily  . fexofenadine (ALLEGRA) 180 mg, Oral, Daily at bedtime  . metoprolol succinate (TOPROL-XL) 100 MG 24 hr tablet 1 tablet, Oral, Daily  . rivaroxaban (XARELTO) 20 mg, Oral  . simvastatin (ZOCOR) 20 mg, Oral, Daily  . sodium chloride (OCEAN) 0.65 % SOLN nasal spray 1 spray, Each Nare, As needed    Radiology:  No results found.  Cardiac Studies:   Echocardiogram 04/15/2018: Left ventricle cavity is normal in size. Mild concentric hypertrophy of the left ventricle. Normal global wall motion. Calculated EF 55%. Mild (Grade I) aortic regurgitation. Mild (Grade I) mitral regurgitation. Mild tricuspid regurgitation.  No evidence of pulmonary hypertension. PFO closure new since previous study in 2017.   PFO closure 05/23/2016: with 25 mm Amplatzer PFO Occluder.  EKG:   EKG 07/08/2020: Sinus bradycardia with heart rate of 57 bpm, normal axis, no evidence of ischemia.  Low-voltage complexes.  Compared to 06/11/2019, nonspecific T abnormality not noted.  EKG 05/26/2016: Atrial fibrillation with rapid ventricular response at the rate of 110 bpm, normal axis, low voltage complexes.  No evidence of ischemia.  Normal QT interval.  Assessment     ICD-10-CM   1. Paroxysmal atrial fibrillation (HCC)  I48.0 EKG 12-Lead  2. Essential hypertension  I10   3. OSA (obstructive sleep apnea)  G47.33   4. Mixed hyperlipidemia  E78.2     EKG  0/27/2021: Sinus bradycardia at rate of 54 bpm, normal axis, nonspecific T abnormality.  Normal QT interval.  Low-voltage complexes.      EKG 05/26/2016: Atrial fibrillation with rapid ventricular response at the rate of 110 bpm, normal axis, low voltage complexes.  No evidence of ischemia.  Normal QT interval.  No orders of the defined types were placed in this encounter.   Medications Discontinued During This Encounter  Medication Reason  . albuterol (PROVENTIL HFA;VENTOLIN HFA) 108 (90 Base) MCG/ACT inhaler Error    Recommendations:   Ashanti Littles  is a 70 y.o. Caucasian male patient with  hypertension, hyperlipidemia, obstructive sleep apnea compliant with CPAP, CVA in 2003 and again in 2017, family history of early CAD, history of PFO closure by [place] with 91m Amplatzer PFO occluder in Jan 2018, and paroxysmal atrial fibrillation also found in Jan 2018 a few days after ASD repair. Due to patient preference, he was continued on anticoagulation.  He is here on annual visit, he has not had any recurrence of atrial fibrillation but due to his preference we will continue anticoagulation.  Suspect his atrial fibrillation was probably related to the device after we closed the PFO.  However he feels much safer to be on anticoagulation.  His blood pressure is controlled, he has not had any palpitations, no clinical evidence of heart failure.  I reviewed his external labs, triglycerides continues to remain elevated although his LDL is at goal.  I would like to try Crestor 10 mg and discontinue simvastatin 20 mg.  Patient would like to start this in 3 months from now and have his labs done in 6 months with his PCP.  Otherwise no changes in the medications were done.  I encouraged him to continue to use the CPAP which he has been.  I will see him back in a year.   JAdrian Prows MD, [facility]2/24/2022, 3:46 PM Office: [phone]: [phone]

## 2020-12-27 ENCOUNTER — Other Ambulatory Visit: Payer: Self-pay

## 2020-12-27 ENCOUNTER — Ambulatory Visit: Payer: Medicare PPO | Admitting: Pulmonary Disease

## 2020-12-27 ENCOUNTER — Encounter: Payer: Self-pay | Admitting: Pulmonary Disease

## 2020-12-27 VITALS — BP 130/80 | HR 56 | Temp 98.1°F | Ht 66.0 in | Wt 238.6 lb

## 2020-12-27 DIAGNOSIS — G4730 Sleep apnea, unspecified: Secondary | ICD-10-CM

## 2020-12-27 DIAGNOSIS — G4733 Obstructive sleep apnea (adult) (pediatric): Secondary | ICD-10-CM | POA: Diagnosis not present

## 2020-12-27 DIAGNOSIS — Z9989 Dependence on other enabling machines and devices: Secondary | ICD-10-CM | POA: Diagnosis not present

## 2020-12-27 DIAGNOSIS — E669 Obesity, unspecified: Secondary | ICD-10-CM

## 2020-12-27 NOTE — Patient Instructions (Signed)
Follow up in 1 year.

## 2020-12-27 NOTE — Progress Notes (Signed)
[facility]  Chief Complaint  Patient presents with   Follow-up    Patient reports doing well on CPAP and does not have any concerns.     Constitutional:  BP 130/80 (BP Location: Left Arm, Patient Position: Sitting, Cuff Size: Large)   Pulse (!) 56   Temp 98.1 F (36.7 C) (Oral)   Ht 5\' 6"  (1.676 m)   Wt 238 lb 9.6 oz (108.2 kg)   SpO2 97%   BMI 38.51 kg/m   Past Medical History:  Asthma, GERD, HTN, HLD, CVA, Allergies  Past Surgical History:  He  has a past surgical history that includes TEE without cardioversion (N/A, 05/02/2016); Patent foramen ovale closure (N/A, 05/23/2016); Hernia repair; and Abdominal hernia repair (1996).  Brief Summary:  Joe Underwood is a 70 y.o. male with obstructive sleep apnea.      Subjective:   He is doing well with CPAP.  Has nasal pillow mask.  Was having sinus congestion.  Improved with mucinex DM and nasal irrigation.  Was previously on dymista but advised by ENT to avoid this since it might have contributed to sinus irritation previously.  Physical Exam:   Appearance - well kempt   ENMT - no sinus tenderness, no oral exudate, no LAN, Mallampati 4 airway, no stridor  Respiratory - equal breath sounds bilaterally, no wheezing or rales  CV - s1s2 regular rate and rhythm, no murmurs  Ext - no clubbing, no edema  Skin - no rashes  Psych - normal mood and affect   Sleep Tests:  Auto CPAP 11/24/20 to 12/23/20 >> used on 30 of 30 nights with average 10 hrs 30 min.  Average AHI 1.6 with median CPAP 9 and 95 th percentile CPAP 10 cm H2O  Cardiac Tests:  Echo 05/24/16 >> EF 55 to 60%  Social History:  He  reports that he quit smoking about 40 years ago. His smoking use included cigarettes. He has a 13.00 pack-year smoking history. He has never used smokeless tobacco. He reports current alcohol use. He reports that he does not use drugs.  Family History:  His family history includes Heart  attack in his father and mother; Heart disease in his brother, father, and mother; Stroke in his brother.     Assessment/Plan:   Obstructive sleep apnea. - he is compliant with CPAP and reports benefit from therapy - uses [employer] for his DME - continue auto CPAP 8 to 10 cm H2O  CPAP rhinitis. - prn mucinex, nasal irrigation - dymista previously caused sinus irritation   Obesity. - he understands the importance of weight loss  Time Spent Involved in Patient Care on Day of Examination:  22 minutes  Follow up:   Patient Instructions  Follow up in 1 year  Medication List:   Allergies as of 12/27/2020       Reactions   Oseltamivir Rash        Medication List        Accurate as of December 27, 2020 11:12 AM. If you have any questions, ask your nurse or doctor.          diphenhydrAMINE 25 mg capsule Commonly known as: BENADRYL Take 25 mg by mouth at bedtime.   famotidine 20 MG tablet Commonly known as: PEPCID Take 20 mg by mouth 2 (two) times daily.   fexofenadine 180 MG tablet Commonly known as: ALLEGRA Take 180 mg by mouth at bedtime.   guaiFENesin 600 MG 12 hr  tablet Commonly known as: MUCINEX Take 600 mg by mouth daily.   metoprolol succinate 100 MG 24 hr tablet Commonly known as: TOPROL-XL Take 1 tablet by mouth daily.   rivaroxaban 20 MG Tabs tablet Commonly known as: XARELTO Take 20 mg by mouth.   rosuvastatin 10 MG tablet Commonly known as: CRESTOR Take 1 tablet (10 mg total) by mouth daily.   sodium chloride 0.65 % Soln nasal spray Commonly known as: OCEAN Place 1 spray into both nostrils as needed for congestion.        Signature:  Coralyn Helling, MD [facility] Care Pager - [phone] 12/27/2020, 11:12 AM

## 2021-01-26 ENCOUNTER — Telehealth: Payer: Self-pay | Admitting: Pulmonary Disease

## 2021-01-26 DIAGNOSIS — G4733 Obstructive sleep apnea (adult) (pediatric): Secondary | ICD-10-CM

## 2021-01-28 NOTE — Telephone Encounter (Signed)
Order placed and I spoke with the pt and notified that this has been done  Nothing further needed

## 2021-01-28 NOTE — Telephone Encounter (Signed)
LMTCB  VS please advise if okay to send order for chin strap. Thanks :)

## 2021-01-28 NOTE — Telephone Encounter (Signed)
Okay to send order for chin strap.

## 2021-07-11 ENCOUNTER — Other Ambulatory Visit: Payer: Self-pay

## 2021-07-11 ENCOUNTER — Ambulatory Visit: Payer: Medicare PPO | Admitting: Cardiology

## 2021-07-11 ENCOUNTER — Encounter: Payer: Self-pay | Admitting: Cardiology

## 2021-07-11 VITALS — BP 106/72 | HR 88 | Temp 98.1°F | Resp 16 | Ht 66.0 in | Wt 230.4 lb

## 2021-07-11 DIAGNOSIS — Z8774 Personal history of (corrected) congenital malformations of heart and circulatory system: Secondary | ICD-10-CM

## 2021-07-11 DIAGNOSIS — E782 Mixed hyperlipidemia: Secondary | ICD-10-CM

## 2021-07-11 DIAGNOSIS — I480 Paroxysmal atrial fibrillation: Secondary | ICD-10-CM

## 2021-07-11 DIAGNOSIS — I10 Essential (primary) hypertension: Secondary | ICD-10-CM

## 2021-07-11 NOTE — Progress Notes (Signed)
Primary Physician/Referring:  [name], PA-C  Patient ID: Joe Underwood, male    DOB: 06-Dec-1950, 71 y.o.   MRN: [id number]  Chief Complaint  Patient presents with   PAF   Hypertension   Hyperlipidemia   Follow-up    1 year   HPI:    Joe Underwood  is a 71 y.o. Caucasian male patient with  hypertension, hyperlipidemia, obstructive sleep apnea compliant with CPAP, CVA in 2003 and again in 2017, family history of early CAD, history of PFO closure by [place] with 74m Amplatzer PFO occluder in Jan 2018, and paroxysmal atrial fibrillation also found in Jan 2018 a few days after ASD repair. Due to patient preference, he was continued on anticoagulation.  Patient presents for annual follow-up.  Last office visit switch patient from simvastatin to Crestor 10 mg daily given uncontrolled triglycerides.  Repeat lipid profile testing to PCP shows lipids are now well controlled.  He noticed irregular heartbeat about 2 weeks ago.  No other associated symptoms of dyspnea, chest pain, dizziness or syncope.  Tolerating Eliquis without bleeding diathesis.  Past Medical History:  Diagnosis Date   Asthma    Essential hypertension    GERD (gastroesophageal reflux disease)    HTN (hypertension)    Hyperlipidemia    OSA on CPAP    Paroxysmal atrial fibrillation ([facility] 04/30/2019   [place] ([facility] [place]; 03/2016   denies any residual on 05/23/2016    Social History   Tobacco Use   Smoking status: Former    Packs/day: 1.00    Years: 13.00    Pack years: 13.00    Types: Cigarettes    Quit date: 06/11/1980    Years since quitting: 41.1   Smokeless tobacco: Never  Substance Use Topics   Alcohol use: Yes    Comment: 2, 2 shots of  liquor drinks ocassionally  Marital status: Widowed  ROS  Review of Systems  Cardiovascular:  Positive for palpitations. Negative for chest pain, dyspnea on exertion and leg swelling.  Gastrointestinal:  Negative for melena.   Objective  Blood pressure 106/72,  pulse 88, temperature 98.1 F (36.7 C), temperature source Temporal, resp. rate 16, height 5' 6" (1.676 m), weight 230 lb 6.4 oz (104.5 kg), SpO2 95 %.  Vitals with BMI 07/11/2021 12/27/2020 07/08/2020  Height 5' 6" 5' 6" 5' 6"  Weight 230 lbs 6 oz 238 lbs 10 oz 234 lbs 6 oz  BMI 37.21 [phone] Systolic [id number] Diastolic 72 80 84  Pulse 88 56 59     Physical Exam Constitutional:      Comments: Moderately obese  Neck:     Thyroid: No thyromegaly.  Cardiovascular:     Rate and Rhythm: Normal rate. Rhythm irregularly irregular.     Pulses: Intact distal pulses.     Heart sounds: Normal heart sounds. No murmur heard.   No gallop.     Comments: No leg edema, no JVD. Pulmonary:     Effort: Pulmonary effort is normal.     Breath sounds: Normal breath sounds.  Abdominal:     General: Bowel sounds are normal.     Palpations: Abdomen is soft.  Musculoskeletal:     Cervical back: Neck supple.  Skin:    General: Skin is warm and dry.   Laboratory examination:  External labs:  06/16/2021: Sodium 141, potassium 4.1, BUN 15, creatinine 1.2, GFR >60 Hgb 15.1, HCT 44.2, MCV 91.6, platelet 158 LDL 54, total cholesterol 120,  triglycerides 87, HDL 54  Labs 06/14/2020:  Sodium 140, potassium 4.7, BUN 18, creatinine 1.08, EGFR >60 mL, bilirubin 1.5.  LFTs otherwise normal.  Total cholesterol 133, triglycerides 169, HDL 51, LDL direct 68.  Hb 15.3/HCT 44.6, platelets 164.  Allergies   Allergies  Allergen Reactions   Oseltamivir Rash    Final Medications at End of Visit     Current Outpatient Medications:    diphenhydrAMINE (BENADRYL) 25 mg capsule, Take 25 mg by mouth at bedtime., Disp: , Rfl:    famotidine (PEPCID) 20 MG tablet, Take 20 mg by mouth 2 (two) times daily., Disp: , Rfl:    fexofenadine (ALLEGRA) 180 MG tablet, Take 180 mg by mouth at bedtime. , Disp: , Rfl:    guaiFENesin (MUCINEX) 600 MG 12 hr tablet, Take 600 mg by mouth daily., Disp: , Rfl:    metoprolol  succinate (TOPROL-XL) 100 MG 24 hr tablet, Take 1 tablet by mouth daily., Disp: , Rfl:    rivaroxaban (XARELTO) 20 MG TABS tablet, Take 20 mg by mouth., Disp: , Rfl:    rosuvastatin (CRESTOR) 10 MG tablet, Take 1 tablet (10 mg total) by mouth daily., Disp: 90 tablet, Rfl: 3   sodium chloride (OCEAN) 0.65 % SOLN nasal spray, Place 1 spray into both nostrils as needed for congestion., Disp: , Rfl:    Radiology:  No results found.  Cardiac Studies:   Echocardiogram 04/15/2018: Left ventricle cavity is normal in size. Mild concentric hypertrophy of the left ventricle. Normal global wall motion. Calculated EF 55%. Mild (Grade I) aortic regurgitation. Mild (Grade I) mitral regurgitation. Mild tricuspid regurgitation.  No evidence of pulmonary hypertension. PFO closure new since previous study in 2017.   PFO closure 05/23/2016: with 25 mm Amplatzer PFO Occluder.  EKG   EKG 07/11/2021: Atrial fibrillation with controlled ventricular response at rate of 75 bpm, normal axis, incomplete right bundle branch block.  Nonspecific T abnormality.  Compared to 07/08/2020, atrial fibrillation is new.  Atrial fibrillation was also evident on 05/26/2016.  Assessment     ICD-10-CM   1. Paroxysmal atrial fibrillation (HCC)  I48.0 EKG 12-Lead    2. Essential hypertension  I10     3. Mixed hyperlipidemia  E78.2     4. History of repair of congenital atrial septal defect (ASD)  Z87.74       CHA2DS2-VASc Score is 4.  Yearly risk of stroke: 4.8% (A, HTN, CVA).  Score of 1=0.6; 2=2.2; 3=3.2; 4=4.8; 5=7.2; 6=9.8; 7=>9.8) -(CHF; HTN; vasc disease DM,  Male = 1; Age <65 =0; 65-74 = 1,  >75 =2; stroke/embolism= 2).    No orders of the defined types were placed in this encounter.   There are no discontinued medications.   Recommendations:   Joe Underwood  is a 71 y.o. Caucasian male patient with  hypertension, hyperlipidemia, obstructive sleep apnea compliant with CPAP, CVA in 2003 and again in 2017,  family history of early CAD, history of PFO closure by [place] with 2m Amplatzer PFO occluder in Jan 2018, and paroxysmal atrial fibrillation also found in Jan 2018 a few days after ASD repair. Due to patient preference, he was continued on anticoagulation.  Patient presents for annual follow-up.  Last office visit switch patient from simvastatin to Crestor 10 mg daily given uncontrolled triglycerides.  Repeat lipid profile testing to PCP shows lipids are now well controlled.  He noticed irregular heartbeats 2 weeks ago, and today he is in persistent atrial fibrillation.  Although asymptomatic, extensive discussion  regarding diastolic dysfunction, rhythm control versus rate control, antiarrhythmic therapy, cardioversion extensively discussed.  Finally patient would like to see if he would go back into a regular rhythm spontaneously, 2-week office visit set up.  I will obtain an echocardiogram.  External labs reviewed, renal function is normal, blood pressure is well controlled, lipids have improved, weight loss again discussed, he has been compliant with CPAP.  No recurrence of TIA-like symptoms since ASD closure/repair.  40-minute office visit encounter. _0 Adrian Prows, MD, [facility] 07/11/2021, 3:24 PM Office: [phone] Pager: [phone]

## 2021-07-18 ENCOUNTER — Other Ambulatory Visit: Payer: Self-pay

## 2021-07-18 ENCOUNTER — Ambulatory Visit: Payer: Medicare PPO

## 2021-07-18 DIAGNOSIS — I480 Paroxysmal atrial fibrillation: Secondary | ICD-10-CM

## 2021-07-28 ENCOUNTER — Encounter: Payer: Self-pay | Admitting: Cardiology

## 2021-07-28 ENCOUNTER — Ambulatory Visit: Payer: Medicare PPO | Admitting: Cardiology

## 2021-07-28 ENCOUNTER — Other Ambulatory Visit: Payer: Self-pay

## 2021-07-28 VITALS — BP 135/82 | HR 51 | Temp 98.7°F | Resp 16 | Ht 66.0 in | Wt 228.6 lb

## 2021-07-28 DIAGNOSIS — I480 Paroxysmal atrial fibrillation: Secondary | ICD-10-CM

## 2021-07-28 NOTE — Progress Notes (Signed)
? ?Primary Physician/Referring:  [name], PA-C ? ?Patient ID: Joe Underwood, male    DOB: 1950/05/20, 71 y.o.   MRN: [id number] ? ?Chief Complaint  ?Patient presents with  ? Atrial Fibrillation  ?  With repeat EKG  ? Follow-up  ?  weeks  ? ?HPI:   ? ?Joe Underwood  is a 71 y.o. Caucasian male patient with  hypertension, hyperlipidemia, obstructive sleep apnea compliant with CPAP, CVA in 2003 and again in 2017, family history of early CAD, history of PFO closure by [place] with 64m Amplatzer PFO occluder in Jan 2018, and paroxysmal atrial fibrillation also found in Jan 2018 a few days after ASD repair. Due to patient preference, he was continued on anticoagulation. ? ?Patient was seen by me on 07/11/2021, was found to be in atrial fibrillation.  I had discussed regarding rate control versus rhythm control, except for occasional palpitations and irregular heart beat 2 weeks prior, he was asymptomatic.   No other associated symptoms of dyspnea, chest pain, dizziness or syncope.  Tolerating Eliquis without bleeding diathesis. ? ?Past Medical History:  ?Diagnosis Date  ? Asthma   ? Essential hypertension   ? GERD (gastroesophageal reflux disease)   ? HTN (hypertension)   ? Hyperlipidemia   ? OSA on CPAP   ? Paroxysmal atrial fibrillation ([facility] [place] 04/30/2019  ? Stroke ([facility] 2003; 03/2016  ? denies any residual on 05/23/2016  ? ? ?Social History  ? ?Tobacco Use  ? Smoking status: Former  ?  Packs/day: 1.00  ?  Years: 13.00  ?  Pack years: 13.00  ?  Types: Cigarettes  ?  Quit date: 06/11/1980  ?  Years since quitting: 41.1  ? Smokeless tobacco: Never  ?Substance Use Topics  ? Alcohol use: Yes  ?  Comment: 2, 2 shots of  liquor drinks ocassionally  ?Marital status: Widowed ? ?ROS  ?Review of Systems  ?Cardiovascular:  Negative for chest pain, dyspnea on exertion, leg swelling and palpitations.  ?Gastrointestinal:  Negative for melena.  ? ?Objective  ?Blood pressure 135/82, pulse (!) 51, temperature 98.7 ?F (37.1 ?C),  temperature source Temporal, resp. rate 16, height _0  (1.676 m), weight 228 lb 9.6 oz (103.7 kg), SpO2 97 %.  ?Vitals with BMI 07/28/2021 07/11/2021 12/27/2020  ?Height _1  _2  _3   ?Weight 228 lbs 10 oz 230 lbs 6 oz 238 lbs 10 oz  ?BMI 36.91 37.21 38.53  ?Systolic [id number] ?Diastolic 82 72 80  ?Pulse 51 88 56  ?  ? Physical Exam ?Constitutional:   ?   Comments: Moderately obese  ?Neck:  ?   Thyroid: No thyromegaly.  ?Cardiovascular:  ?   Rate and Rhythm: Regular rhythm. Bradycardia present.  ?   Pulses: Intact distal pulses.  ?   Heart sounds: Normal heart sounds. No murmur heard. ?  No gallop.  ?   Comments: No leg edema, no JVD. ?Pulmonary:  ?   Effort: Pulmonary effort is normal.  ?   Breath sounds: Normal breath sounds.  ?Abdominal:  ?   General: Bowel sounds are normal.  ?   Palpations: Abdomen is soft.  ?Musculoskeletal:  ?   Cervical back: Neck supple.  ?Skin: ?   General: Skin is warm and dry.  ? ?Laboratory examination:  ?External labs:  ?06/16/2021: ?Sodium 141, potassium 4.1, BUN 15, creatinine 1.2, GFR >60 ?Hgb 15.1, HCT 44.2, MCV 91.6, platelet 158 ?LDL 54, total cholesterol 120, triglycerides 87, HDL 54 ? ?Labs 06/14/2020: ? ?  Sodium 140, potassium 4.7, BUN 18, creatinine 1.08, EGFR >60 mL, bilirubin 1.5.  LFTs otherwise normal. ? ?Total cholesterol 133, triglycerides 169, HDL 51, LDL direct 68. ? ?Hb 15.3/HCT 44.6, platelets 164. ? ?Allergies  ? ?Allergies  ?Allergen Reactions  ? Oseltamivir Rash  ?  ?Final Medications at End of Visit   ? ? ?Current Outpatient Medications:  ?  diphenhydrAMINE (BENADRYL) 25 mg capsule, Take 25 mg by mouth at bedtime., Disp: , Rfl:  ?  famotidine (PEPCID) 20 MG tablet, Take 20 mg by mouth 2 (two) times daily., Disp: , Rfl:  ?  fexofenadine (ALLEGRA) 180 MG tablet, Take 180 mg by mouth at bedtime. , Disp: , Rfl:  ?  guaiFENesin (MUCINEX) 600 MG 12 hr tablet, Take 600 mg by mouth daily., Disp: , Rfl:  ?  metoprolol succinate (TOPROL-XL) 100 MG 24 hr tablet, Take 1  tablet by mouth daily., Disp: , Rfl:  ?  rivaroxaban (XARELTO) 20 MG TABS tablet, Take 20 mg by mouth., Disp: , Rfl:  ?  rosuvastatin (CRESTOR) 10 MG tablet, Take 1 tablet (10 mg total) by mouth daily., Disp: 90 tablet, Rfl: 3 ?  sodium chloride (OCEAN) 0.65 % SOLN nasal spray, Place 1 spray into both nostrils as needed for congestion., Disp: , Rfl:   ? ?Radiology:  ?No results found. ? ?Cardiac Studies:  ? ?PFO closure 05/23/2016: with 25 mm Amplatzer PFO Occluder. ? ?PCV ECHOCARDIOGRAM COMPLETE 07/18/2021  ?Left ventricle cavity is normal in size. Mild concentric hypertrophy of the left ventricle. Normal global wall motion. Normal LV systolic function with EF 63%. Normal diastolic filling pattern. ?Well seated 25 mm Amplatzer PFO occluder. No 2D and Doppler evidence of residual shunting. ?Structurally normal trileaflet aortic valve.  Mild (Grade I) aortic regurgitation. ?Trace mitral and tricuspid regurgitation. ?Mild pulmonic regurgitation. ?No evidence of pulmonary hypertension. ?No significant change compared to previous study in 2019. ?   ?EKG  ? ?EKG 07/28/2021: Marked sinus bradycardia at 48 bpm, nonspecific T abnormality.  Compared to 07/11/2021, atrial fibrillation is not present. ? ?EKG 07/11/2021: Atrial fibrillation with controlled ventricular response at rate of 75 bpm, normal axis, incomplete right bundle branch block.  Nonspecific T abnormality.  Compared to 07/08/2020, atrial fibrillation is new.  Atrial fibrillation was also evident on 05/26/2016. ? ?Assessment  ? ?  ICD-10-CM   ?1. Paroxysmal atrial fibrillation (HCC)  I48.0 EKG 12-Lead  ?  ?2. Essential hypertension  I10   ?  ?  ?CHA2DS2-VASc Score is 4.  Yearly risk of stroke: 4.8% (A, HTN, CVA).  Score of 1=0.6; 2=2.2; 3=3.2; 4=4.8; 5=7.2; 6=9.8; 7=>9.8) ?-(CHF; HTN; vasc disease DM,  Male = 1; Age <65 =0; 65-74 = 1,  >75 =2; stroke/embolism= 2).   ? ?No orders of the defined types were placed in this encounter. ?  ?There are no discontinued  medications. ?  ?Recommendations:  ? ?Joe Underwood  is a 71 y.o. Caucasian male patient with  hypertension, hyperlipidemia, obstructive sleep apnea compliant with CPAP, CVA in 2003 and again in 2017, family history of early CAD, history of PFO closure by [place] with 87m Amplatzer PFO occluder in Jan 2018, and paroxysmal atrial fibrillation also found in Jan 2018 a few days after ASD repair. Due to patient preference, he was continued on anticoagulation. ? ?Patient was seen by me on 07/11/2021, was found to be in atrial fibrillation.  I had discussed regarding rate control versus rhythm control, except for occasional palpitations he was asymptomatic.  Fortunately his  EKG reveals sinus rhythm, underlying sinus bradycardia is chronic for him and he remains asymptomatic.  Blood pressure is also well controlled.  No changes in the medications were done today. ? ?I reviewed the echocardiogram, looks like he was already back in sinus rhythm on 07/18/2021, normal LVEF and mild diastolic dysfunction.  I would like to see him back in 6 months, weight loss again discussed.  He has been compliant with CPAP. ? ? ?\ ?Adrian Prows, MD, [facility] ?07/28/2021, 11:20 AM ?Office: [phone] ?Pager: [phone]  ?

## 2022-01-09 LAB — COLOGUARD: COLOGUARD: NEGATIVE

## 2022-02-07 ENCOUNTER — Encounter: Payer: Self-pay | Admitting: Pulmonary Disease

## 2022-02-07 ENCOUNTER — Ambulatory Visit: Payer: Medicare PPO | Admitting: Pulmonary Disease

## 2022-02-07 VITALS — BP 120/80 | HR 52 | Ht 66.0 in | Wt 234.2 lb

## 2022-02-07 DIAGNOSIS — Z9989 Dependence on other enabling machines and devices: Secondary | ICD-10-CM

## 2022-02-07 DIAGNOSIS — G4733 Obstructive sleep apnea (adult) (pediatric): Secondary | ICD-10-CM | POA: Diagnosis not present

## 2022-02-07 NOTE — Patient Instructions (Signed)
You should be eligible for a new CPAP machine in 2024  Follow up in 1 year

## 2022-02-07 NOTE — Progress Notes (Signed)
[place] [facility]  Chief Complaint  Patient presents with   Follow-up    Constitutional:  BP 120/80 (BP Location: Left Arm)   Pulse (!) 52   Ht 5\' 6"  (1.676 m)   Wt 234 lb 3.2 oz (106.2 kg)   SpO2 95%   BMI 37.80 kg/m   Past Medical History:  Asthma, GERD, HTN, HLD, CVA, Allergies, Paroxysmal atrial fibrillation  Past Surgical History:  He  has a past surgical history that includes TEE without cardioversion (N/A, 05/02/2016); Patent foramen ovale closure (N/A, 05/23/2016); Hernia repair; and Abdominal hernia repair (1996).  Brief Summary:  Joe Underwood is a 71 y.o. male with obstructive sleep apnea.      Subjective:   He is doing well with CPAP.  Uses nasal pillows mask.  Gets occasional mouth dryness.  Not having sinus congestion.  Sleeping well.  Naps some during the day.  He uses his old machine when he travels.  Physical Exam:   Appearance - well kempt   ENMT - no sinus tenderness, no oral exudate, no LAN, Mallampati 3 airway, no stridor  Respiratory - equal breath sounds bilaterally, no wheezing or rales  CV - s1s2 regular rate and rhythm, no murmurs  Ext - no clubbing, no edema  Skin - no rashes  Psych - normal mood and affect    Sleep Tests:  Auto CPAP 01/07/22 to 02/05/22 >> used on 30 of 30 nights with average 11 hrs 45 min.  Average AHI 1.8 with median CPAP 9 and 95 th percentile CPAP 10 cm H2O  Cardiac Tests:  Echo 07/18/21 >> EF 63%, mild AR  Social History:  He  reports that he quit smoking about 41 years ago. His smoking use included cigarettes. He has a 13.00 pack-year smoking history. He has never used smokeless tobacco. He reports current alcohol use. He reports that he does not use drugs.  Family History:  His family history includes Heart attack in his father and mother; Heart disease in his brother, father, and mother; Stroke in his brother.     Assessment/Plan:   Obstructive sleep apnea. - he  is compliant with CPAP and reports benefit from therapy - uses [employer] for his DME - he should be eligible for a new machine in 2024 - continue auto CPAP 8 to 10 cm H2O  CPAP rhinitis. - prn mucinex, nasal irrigation - dymista previously caused sinus irritation   Paroxysmal atrial fibrillation. - followed by Dr. Adrian Prows with cardiology  Time Spent Involved in Patient Care on Day of Examination:  16 minutes  Follow up:   Patient Instructions  You should be eligible for a new CPAP machine in 2024  Follow up in 1 year  Medication List:   Allergies as of 02/07/2022       Reactions   Oseltamivir Rash        Medication List        Accurate as of February 07, 2022 12:16 PM. If you have any questions, ask your nurse or doctor.          diphenhydrAMINE 25 mg capsule Commonly known as: BENADRYL Take 25 mg by mouth at bedtime.   famotidine 20 MG tablet Commonly known as: PEPCID Take 20 mg by mouth 2 (two) times daily.   fexofenadine 180 MG tablet Commonly known as: ALLEGRA Take 180 mg by mouth at bedtime.   guaiFENesin 600 MG 12 hr tablet Commonly known as: MUCINEX Take 600  mg by mouth daily.   metoprolol succinate 100 MG 24 hr tablet Commonly known as: TOPROL-XL Take 1 tablet by mouth daily.   rivaroxaban 20 MG Tabs tablet Commonly known as: XARELTO Take 20 mg by mouth.   rosuvastatin 10 MG tablet Commonly known as: CRESTOR Take 1 tablet (10 mg total) by mouth daily.   sodium chloride 0.65 % Soln nasal spray Commonly known as: OCEAN Place 1 spray into both nostrils as needed for congestion.        Signature:  Coralyn Helling, MD [facility] Pulmonary/Critical Care Pager - [phone] 02/07/2022, 12:16 PM

## 2022-07-12 ENCOUNTER — Ambulatory Visit: Payer: Medicare PPO | Admitting: Cardiology

## 2022-07-19 ENCOUNTER — Ambulatory Visit: Payer: Medicare PPO | Admitting: Cardiology

## 2022-07-19 ENCOUNTER — Encounter: Payer: Self-pay | Admitting: Cardiology

## 2022-07-19 VITALS — BP 128/80 | HR 60 | Resp 16 | Ht 66.0 in | Wt 242.0 lb

## 2022-07-19 DIAGNOSIS — I480 Paroxysmal atrial fibrillation: Secondary | ICD-10-CM

## 2022-07-19 DIAGNOSIS — I10 Essential (primary) hypertension: Secondary | ICD-10-CM

## 2022-07-19 NOTE — Progress Notes (Signed)
Primary Physician/Referring:  [name], PA-C  Patient ID: Joe Underwood, male    DOB: Mar 18, 1951, 72 y.o.   MRN: [id number]  Chief Complaint  Patient presents with   Atrial Fibrillation   Hypertension   Follow-up   HPI:    Joe Underwood  is a 72 y.o. Caucasian male patient with  hypertension, hyperlipidemia, obstructive sleep apnea compliant with CPAP, CVA in 2003 and again in 2017, family history of early CAD, history of PFO closure by [place] with 21m Amplatzer PFO occluder in Jan 2018, and paroxysmal atrial fibrillation also found in Jan 2018 a few days after ASD repair.  Patient also had atrial fibrillation 72 07/11/2021.     He presents here for 72-monthffice visit, states that he has had 3 episodes brief of atrial fibrillation.  No other associated symptoms.  He is tolerating all his medications well and feels symptoms of palpitations worse if he misses a dose of metoprolol.  Otherwise remains asymptomatic. Tolerating Eliquis without bleeding diathesis.  Past Medical History:  Diagnosis Date   Asthma    Essential hypertension    GERD (gastroesophageal reflux disease)    HTN (hypertension)    Hyperlipidemia    OSA on CPAP    [place]   [place]; 03[place]   denies any residual on 05/23/2016    Social History   Tobacco Use   Smoking status: Former    Packs/day: 1.00    Years: 13.00    Total pack years: 13.00    Types: Cigarettes    Quit date: 06/11/1980    Years since quitting: 42.1   Smokeless tobacco: Never  Substance Use Topics   Alcohol use: Yes    Comment: 2, 2 shots of  liquor drinks ocassionally  Marital status: Widowed  ROS  Review of Systems  Cardiovascular:  Positive for palpitations. Negative for chest pain, dyspnea on exertion and leg swelling.  Gastrointestinal:  Negative for melena.   Objective  Blood pressure 128/80, pulse 60, resp. rate 16, height '5\' 6"'$  (1.676 m), weight 242 lb (109.8 kg),  SpO2 98 %.     07/19/2022    2:14 PM 02/07/2022   11:46 AM 07/28/2021   11:15 AM  Vitals with BMI  Height '5\' 6"'$  '5\' 6"'$  '5\' 6"'$   Weight 242 lbs 234 lbs 3 oz 228 lbs 10 oz  BMI 39.08 [id number] [id number] 80 80 82  Pulse 60 52 51     Physical Exam Constitutional:      Appearance: He is obese.  Neck:     Vascular: No carotid bruit or JVD.  Cardiovascular:     Rate and Rhythm: Normal rate and regular rhythm.     Pulses: Intact distal pulses.     Heart sounds: Normal heart sounds. No murmur heard.    No gallop.  Pulmonary:     Effort: Pulmonary effort is normal.     Breath sounds: Normal breath sounds.  Abdominal:     General: Bowel sounds are normal.     Palpations: Abdomen is soft.  Musculoskeletal:     Right lower leg: No edema.     Left lower leg: No edema.    Laboratory examination:  External labs:   Labs 06/20/2022:  Total cholesterol 116, triglycerides 134, HDL 52, direct LDL 42.  Hb 15.1/HCT 44.3, platelets 163.  Sodium 140, potassium 4.6, serum glucose '1 1 5 '$ mg, BUN 14, creatinine 1.16, EGFR  67 mL, LFTs normal.  TSH normal at 3.71.  Allergies   Allergies  Allergen Reactions   Oseltamivir Rash    Final Medications at End of Visit     Current Outpatient Medications:    diphenhydrAMINE (BENADRYL) 25 mg capsule, Take 25 mg by mouth at bedtime., Disp: , Rfl:    famotidine (PEPCID) 20 MG tablet, Take 20 mg by mouth 2 (two) times daily., Disp: , Rfl:    fexofenadine (ALLEGRA) 180 MG tablet, Take 180 mg by mouth at bedtime. , Disp: , Rfl:    guaiFENesin (MUCINEX) 600 MG 12 hr tablet, Take 600 mg by mouth daily., Disp: , Rfl:    metoprolol succinate (TOPROL-XL) 100 MG 24 hr tablet, Take 1 tablet by mouth daily., Disp: , Rfl:    rivaroxaban (XARELTO) 20 MG TABS tablet, Take 20 mg by mouth., Disp: , Rfl:    rosuvastatin (CRESTOR) 10 MG tablet, Take 1 tablet (10 mg total) by mouth daily., Disp: 90 tablet, Rfl: 3   sodium chloride (OCEAN) 0.65 %  SOLN nasal spray, Place 1 spray into both nostrils as needed for congestion., Disp: , Rfl:    Radiology:  No results found.  Cardiac Studies:   PFO closure 05/23/2016: with 25 mm Amplatzer PFO Occluder.  PCV ECHOCARDIOGRAM COMPLETE 07/18/2021  Left ventricle cavity is normal in size. Mild concentric hypertrophy of the left ventricle. Normal global wall motion. Normal LV systolic function with EF 63%. Normal diastolic filling pattern. Well seated 25 mm Amplatzer PFO occluder. No 2D and Doppler evidence of residual shunting. Structurally normal trileaflet aortic valve.  Mild (Grade I) aortic regurgitation. Trace mitral and tricuspid regurgitation. Mild pulmonic regurgitation. No evidence of pulmonary hypertension. No significant change compared to previous study in 2019.    EKG   EKG 07/19/2022: Normal sinus rhythm with rate of 59 troponin, normal axis, nonspecific T wave flattening.  Compared to 07/28/2021, no significant change. Compared to 07/11/2021, atrial fibrillation is not present.  Assessment     ICD-10-CM   1. Paroxysmal atrial fibrillation (HCC)  I48.0 EKG 12-Lead    2. Essential hypertension  I10       CHA2DS2-VASc Score is 4.  Yearly risk of stroke: 4.8% (A, HTN, CVA).  Score of 1=0.6; 2=2.2; 3=3.2; 4=4.8; 5=7.2; 6=9.8; 7=>9.8) -(CHF; HTN; vasc disease DM,  Male = 1; Age <65 =0; 65-74 = 1,  >75 =2; stroke/embolism= 2).    No orders of the defined types were placed in this encounter.   There are no discontinued medications.   Recommendations:   Joe Underwood  is a 72 y.o. 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  Patient also had atrial fibrillation 72 07/11/2021.     1. Paroxysmal  atrial fibrillation ([facility] Patient with paroxysmal episodes of atrial fibrillation, he is presently on appropriate anticoagulation with Xarelto, continue the same.  He has had 2-3 episodes of palpitations that were only short-lived.  He remains asymptomatic presently.  2. Essential hypertension Blood pressure under excellent control.  I reviewed his external labs, CBC is normal, renal function is also normal.  In September 2023, he had sepsis-like presentation and significant abdominal discomfort, eventually diagnosed with probably viral infection, CT scan and MRI of the  abdomen were all within normal limits.  No changes in medications were done today.  I will see him back in a year or sooner if problems.  He has been compliant with CPAP.   \ Adrian Prows, MD, [facility] 07/19/2022, 3:18 PM Office: [phone] Pager: [phone]

## 2023-03-07 ENCOUNTER — Ambulatory Visit: Payer: Medicare PPO | Admitting: Primary Care

## 2023-03-07 ENCOUNTER — Encounter: Payer: Self-pay | Admitting: Primary Care

## 2023-03-07 VITALS — BP 130/70 | HR 58 | Ht 66.0 in | Wt 241.6 lb

## 2023-03-07 DIAGNOSIS — G4733 Obstructive sleep apnea (adult) (pediatric): Secondary | ICD-10-CM | POA: Diagnosis not present

## 2023-03-07 NOTE — Progress Notes (Signed)
@Patient  ID: Joe Underwood, male    DOB: 1950/06/04, 73 y.o.   MRN: [id number]  Chief Complaint  Patient presents with   Follow-up    Referring provider: [name], PA-C  HPI: 72 year old male, former smoker.  Past medical history significant for hypertension, A-fib, OSA.  Former patient of Dr. Craige Cotta.  03/07/2023 Patient presents today for annual OSA follow-up. He is doing well. No acute complaints.  He is eligible for replacement CPAP machine in 2024, he'd like to wait until after the holidays to get a new machine. Current pressure 8-10cm h20. He uses a travel CPAP machine when he goes to the beach, unsure pressure setting but thinks its set at 4cm h20.   Airview download 02/06/2023 - 03/07/2023 Usage 30/30 days (100%); 29 days (97%) greater than 4 hours Average usage 11 hours 27 minutes Pressure 8 to 10 cm H2O Air leak 6.9 L/min (95%) AHI 1.8   Allergies  Allergen Reactions   Oseltamivir Rash    Immunization History  Administered Date(s) Administered   Influenza Split 03/17/2013, 02/11/2018, 12/28/2018   Influenza, High Dose Seasonal PF 02/23/2016, 02/01/2017, 02/18/2018, 01/09/2019   Influenza, Seasonal, Injecte, Preservative Fre 03/17/2013   Influenza,inj,Quad PF,6+ Mos 01/14/2016   Influenza,inj,quad, With Preservative 01/26/2014, 01/14/2016   Influenza-Unspecified 03/17/2013, 02/11/2018, 12/28/2018   PFIZER Comirnaty(Gray Top)Covid-19 Tri-Sucrose Vaccine 08/16/2020, 01/21/2021   PFIZER(Purple Top)SARS-COV-2 Vaccination 06/06/2019, 06/27/2019, 01/14/2020   Pfizer Covid-19 Vaccine Bivalent Booster 66yrs & up 08/15/2021   Pfizer(Comirnaty)Fall Seasonal Vaccine 12 years and older 02/28/2022, 08/09/2022   Pneumococcal Conjugate-13 05/04/2014   Pneumococcal Polysaccharide-23 05/18/2016   Tdap 03/17/2013   Zoster Recombinant(Shingrix) 06/13/2017   Zoster, Live 03/07/2013    Past Medical History:  Diagnosis Date   Asthma    Essential hypertension    GERD  (gastroesophageal reflux disease)    HTN (hypertension)    Hyperlipidemia    OSA on CPAP    Paroxysmal atrial fibrillation (HCC) 04/30/2019   Stroke (HCC) 2003; 03/2016   denies any residual on 05/23/2016    Tobacco History: Social History   Tobacco Use  Smoking Status Former   Current packs/day: 0.00   Average packs/day: 1 pack/day for 13.0 years (13.0 ttl pk-yrs)   Types: Cigarettes   Start date: 06/12/1967   Quit date: 06/11/1980   Years since quitting: 42.7  Smokeless Tobacco Never   Counseling given: Not Answered   Outpatient Medications Prior to Visit  Medication Sig Dispense Refill   diphenhydrAMINE (BENADRYL) 25 mg capsule Take 25 mg by mouth at bedtime.     famotidine (PEPCID) 20 MG tablet Take 20 mg by mouth daily.     fexofenadine (ALLEGRA) 180 MG tablet Take 180 mg by mouth at bedtime.      guaiFENesin (MUCINEX) 600 MG 12 hr tablet Take 600 mg by mouth daily.     metoprolol succinate (TOPROL-XL) 100 MG 24 hr tablet Take 1 tablet by mouth daily.     rivaroxaban (XARELTO) 20 MG TABS tablet Take 20 mg by mouth.     sodium chloride (OCEAN) 0.65 % SOLN nasal spray Place 1 spray into both nostrils as needed for congestion.     rosuvastatin (CRESTOR) 10 MG tablet Take 1 tablet (10 mg total) by mouth daily. 90 tablet 3   No facility-administered medications prior to visit.   Review of Systems  Review of Systems  Constitutional: Negative.   Respiratory: Negative.    Cardiovascular: Negative.    Physical Exam  BP 130/70 (BP Location: Left  Arm, Cuff Size: Large)   Pulse (!) 58   Ht 5\' 6"  (1.676 m)   Wt 241 lb 9.6 oz (109.6 kg)   SpO2 94%   BMI 39.00 kg/m  Physical Exam Constitutional:      General: He is not in acute distress.    Appearance: Normal appearance. He is not ill-appearing.  HENT:     Head: Normocephalic and atraumatic.     Mouth/Throat:     Mouth: Mucous membranes are moist.     Pharynx: Oropharynx is clear.  Cardiovascular:     Rate and  Rhythm: Normal rate and regular rhythm.  Pulmonary:     Effort: Pulmonary effort is normal.     Breath sounds: Normal breath sounds.  Musculoskeletal:        General: Normal range of motion.  Skin:    General: Skin is warm and dry.  Neurological:     General: No focal deficit present.     Mental Status: He is alert and oriented to person, place, and time. Mental status is at baseline.  Psychiatric:        Mood and Affect: Mood normal.        Behavior: Behavior normal.        Thought Content: Thought content normal.        Judgment: Judgment normal.      Lab Results:  CBC    Component Value Date/Time   WBC 5.1 06/30/2016 0117   RBC 5.01 06/30/2016 0117   HGB 15.3 06/30/2016 0117   HCT 45.3 06/30/2016 0117   PLT 156 06/30/2016 0117   MCV 90.4 06/30/2016 0117   MCH 30.5 06/30/2016 0117   MCHC 33.8 06/30/2016 0117   RDW 13.5 06/30/2016 0117    BMET    Component Value Date/Time   NA 142 06/30/2016 0117   K 4.1 06/30/2016 0117   CL 106 06/30/2016 0117   CO2 22 06/30/2016 0117   GLUCOSE 86 06/30/2016 0117   BUN 15 06/30/2016 0117   CREATININE 1.05 06/30/2016 0117   CALCIUM 9.0 06/30/2016 0117   GFRNONAA >60 06/30/2016 0117   GFRAA >60 06/30/2016 0117    BNP No results found for: "BNP"  ProBNP No results found for: "PROBNP"  Imaging: No results found.   Assessment & Plan:   OSA (obstructive sleep apnea) - Patient is 100% compliant with CPAP use over the last 30 days reports benefit from use.  Average usage 11 hours 27 minutes.  Current pressure 8 to 10 cm H2O; residual AHI 1.8/h.  Changes today.  Advised patient continue with CPAP nightly 4 to 6 hours or longer.  He is eligible for replacement CPAP machine in 2024, patient will notify us when he would like order placed.  Sleep apnea is well controlled. No changes today - Call our office or send MyChart message when you'd like [place] to place order for new CPAP Machine  - You can change pressure on travel CPAP if  needed, would recommend either 8 or 9cm h20  - Make sure you clean mask/tubing daily, water chamber weekly, change mask every 2-3 months and change your filter monthly  Follow-up - 1 year with Waynetta Sandy NP or sooner if needed    Glenford Bayley, NP 03/19/2023

## 2023-03-07 NOTE — Patient Instructions (Addendum)
- Sleep apnea is well controlled. No changes today - Call our office or send MyChart message when you'd like [place] to place order for new CPAP Machine  - You can change pressure on travel CPAP if needed, would recommend either 8 or 9cm h20  - Make sure you clear mask/tubing daily, water chamber weekly, change mask every 2-3 months and change your filter monthly  Follow-up - 1 year with [facility] NP or sooner if needed   CPAP and BIPAP Information CPAP and BIPAP are methods that use air pressure to keep your airways open and to help you breathe well. CPAP and BIPAP use different amounts of pressure. Your health care provider will tell you whether CPAP or BIPAP would be more helpful for you. CPAP stands for "continuous positive airway pressure." With CPAP, the amount of pressure stays the same while you breathe in (inhale) and out (exhale). BIPAP stands for "bi-level positive airway pressure." With BIPAP, the amount of pressure will be higher when you inhale and lower when you exhale. This allows you to take larger breaths. CPAP or BIPAP may be used in the hospital, or your health care provider may want you to use it at home. You may need to have a sleep study before your health care provider can order a machine for you to use at home. What are the advantages? CPAP or BIPAP can be helpful if you have: Sleep apnea. Chronic obstructive pulmonary disease (COPD). Heart failure. Medical conditions that cause muscle weakness, including muscular dystrophy or amyotrophic lateral sclerosis (ALS). Other problems that cause breathing to be shallow, weak, abnormal, or difficult. CPAP and BIPAP are most commonly used for obstructive sleep apnea (OSA) to keep the airways from collapsing when the muscles relax during sleep. What are the risks? Generally, this is a safe treatment. However, problems may occur, including: Irritated skin or skin sores if the mask does not fit properly. Dry or stuffy nose or  nosebleeds. Dry mouth. Feeling gassy or bloated. Sinus or lung infection if the equipment is not cleaned properly. When should CPAP or BIPAP be used? In most cases, the mask only needs to be worn during sleep. Generally, the mask needs to be worn throughout the night and during any daytime naps. People with certain medical conditions may also need to wear the mask at other times, such as when they are awake. Follow instructions from your health care provider about when to use the machine. What happens during CPAP or BIPAP?  Both CPAP and BIPAP are provided by a small machine with a flexible plastic tube that attaches to a plastic mask that you wear. Air is blown through the mask into your nose or mouth. The amount of pressure that is used to blow the air can be adjusted on the machine. Your health care provider will set the pressure setting and help you find the best mask for you. Tips for using the mask Because the mask needs to be snug, some people feel trapped or closed-in (claustrophobic) when first using the mask. If you feel this way, you may need to get used to the mask. One way to do this is to hold the mask loosely over your nose or mouth and then gradually apply the mask more snugly. You can also gradually increase the amount of time that you use the mask. Masks are available in various types and sizes. If your mask does not fit well, talk with your health care provider about getting a different one.  Some common types of masks include: Full face masks, which fit over the mouth and nose. Nasal masks, which fit over the nose. Nasal pillow or prong masks, which fit into the nostrils. If you are using a mask that fits over your nose and you tend to breathe through your mouth, a chin strap may be applied to help keep your mouth closed. Use a skin barrier to protect your skin as told by your health care provider. Some CPAP and BIPAP machines have alarms that may sound if the mask comes off or  develops a leak. If you have trouble with the mask, it is very important that you talk with your health care provider about finding a way to make the mask easier to tolerate. Do not stop using the mask. There could be a negative impact on your health if you stop using the mask. Tips for using the machine Place your CPAP or BIPAP machine on a secure table or stand near an electrical outlet. Know where the on/off switch is on the machine. Follow instructions from your health care provider about how to set the pressure on your machine and when you should use it. Do not eat or drink while the CPAP or BIPAP machine is on. Food or fluids could get pushed into your lungs by the pressure of the CPAP or BIPAP. For home use, CPAP and BIPAP machines can be rented or purchased through home health care companies. Many different brands of machines are available. Renting a machine before purchasing may help you find out which particular machine works well for you. Your health insurance company may also decide which machine you may get. Keep the CPAP or BIPAP machine and attachments clean. Ask your health care provider for specific instructions. Check the humidifier if you have a dry stuffy nose or nosebleeds. Make sure it is working correctly. Follow these instructions at home: Take over-the-counter and prescription medicines only as told by your health care provider. Ask if you can take sinus medicine if your sinuses are blocked. Do not use any products that contain nicotine or tobacco. These products include cigarettes, chewing tobacco, and vaping devices, such as e-cigarettes. If you need help quitting, ask your health care provider. Keep all follow-up visits. This is important. Contact a health care provider if: You have redness or pressure sores on your head, face, mouth, or nose from the mask or head gear. You have trouble using the CPAP or BIPAP machine. You cannot tolerate wearing the CPAP or BIPAP  mask. Someone tells you that you snore even when wearing your CPAP or BIPAP. Get help right away if: You have trouble breathing. You feel confused. Summary CPAP and BIPAP are methods that use air pressure to keep your airways open and to help you breathe well. If you have trouble with the mask, it is very important that you talk with your health care provider about finding a way to make the mask easier to tolerate. Do not stop using the mask. There could be a negative impact to your health if you stop using the mask. Follow instructions from your health care provider about when to use the machine. This information is not intended to replace advice given to you by your health care provider. Make sure you discuss any questions you have with your health care provider. Document Revised: 12/08/2020 Document Reviewed: 04/09/2020 Elsevier Patient Education  2023 [employer].

## 2023-03-19 NOTE — Assessment & Plan Note (Signed)
-   Patient is 100% compliant with CPAP use over the last 30 days reports benefit from use.  Average usage 11 hours 27 minutes.  Current pressure 8 to 10 cm H2O; residual AHI 1.8/h.  Changes today.  Advised patient continue with CPAP nightly 4 to 6 hours or longer.  He is eligible for replacement CPAP machine in 2024, patient will notify us when he would like order placed.

## 2023-07-19 ENCOUNTER — Ambulatory Visit: Payer: Self-pay | Admitting: Cardiology

## 2023-07-20 ENCOUNTER — Encounter: Payer: Self-pay | Admitting: Cardiology

## 2023-07-20 ENCOUNTER — Ambulatory Visit: Payer: Medicare PPO | Attending: Cardiology | Admitting: Cardiology

## 2023-07-20 VITALS — BP 138/70 | HR 54 | Resp 16 | Ht 66.0 in | Wt 238.2 lb

## 2023-07-20 DIAGNOSIS — I480 Paroxysmal atrial fibrillation: Secondary | ICD-10-CM

## 2023-07-20 DIAGNOSIS — I10 Essential (primary) hypertension: Secondary | ICD-10-CM

## 2023-07-20 NOTE — Patient Instructions (Signed)
 Medication Instructions:  Your physician recommends that you continue on your current medications as directed. Please refer to the Current Medication list given to you today. *If you need a refill on your cardiac medications before your next appointment, please call your pharmacy*   Follow-Up: At [facility], you and your health needs are our priority.  As part of our continuing mission to provide you with exceptional heart care, we have created designated Provider Care Teams.  These Care Teams include your primary Cardiologist (physician) and Advanced Practice Providers (APPs -  Physician Assistants and Nurse Practitioners) who all work together to provide you with the care you need, when you need it.  We recommend signing up for the patient portal called "MyChart".  Sign up information is provided on this After Visit Summary.  MyChart is used to connect with patients for Virtual Visits (Telemedicine).  Patients are able to view lab/test results, encounter notes, upcoming appointments, etc.  Non-urgent messages can be sent to your provider as well.   To learn more about what you can do with MyChart, go to [email or web address].    Your next appointment:   As Needed  Provider:   Dr Jacinto Halim

## 2023-07-20 NOTE — Progress Notes (Signed)
 Cardiology Office Note:  .   Date:  07/21/2023  ID:  Sue Lush, DOB [birth year], MRN [id number] PCP: Yates Decamp, MD  [facility] Providers Cardiologist:  None   History of Present Illness: Joe Underwood is a 73 y.o. 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 Patient also had atrial fibrillation on 07/11/2021.   Discussed the use of AI scribe software for clinical note transcription with the patient, who gave verbal consent to proceed.  History of Present Illness   The patient, with a history of atrial fibrillation, hyperlipidemia, and hypertension, presents for a routine follow-up. He reports feeling well and denies any new or worsening symptoms. He has had one or two episodes of heart racing in the past year, but these resolved spontaneously without the need for additional metoprolol. He has not noticed any increase in heart rate during these episodes.  The patient has made dietary changes due to a previous increase in his A1c, which has since improved from 6.2 to 5.9. He has also started resistance training and plans to incorporate aerobic exercise into his routine.  In addition, the patient reports regular bowel movements and denies any blood in the stool. He has been traveling locally and plans to visit the beach.      Labs    External Labs:  Care everywhere labs 06/26/2023:  Total cholesterol 96, triglycerides 136, HDL 42, LDL 32.  Sodium 135, potassium 4.6, serum glucose 97 mg, BUN 13, creatinine 1.18, EGFR 65 mL.  A1c 5.9%.  TSH normal at 2.39.   Review of Systems  Cardiovascular:  Positive for palpitations (rare). Negative for chest pain, dyspnea on exertion and leg swelling.    Physical Exam:   VS:  BP 138/70 (BP Location: Left Arm, Patient Position: Sitting, Cuff Size: Large)   Pulse (!) 54   Resp 16   Ht 5\' 6"  (1.676 m)   Wt 238 lb 3.2 oz (108 kg)   SpO2 97%   BMI 38.45 kg/m    Wt Readings from Last 3 Encounters:  07/20/23 238 lb 3.2 oz (108 kg)  03/07/23 241 lb 9.6 oz (109.6 kg)  07/19/22 242 lb (109.8 kg)    Physical Exam Constitutional:      Appearance: He is obese.  Neck:     Vascular: No carotid bruit or JVD.  Cardiovascular:     Rate and Rhythm: Normal rate and regular rhythm.     Pulses: Intact distal pulses.     Heart sounds: Normal heart sounds. No murmur heard.    No gallop.  Pulmonary:     Effort: Pulmonary effort is normal.     Breath sounds: Normal breath sounds.  Abdominal:     General: Bowel sounds are normal.     Palpations: Abdomen is soft.  Musculoskeletal:     Right lower leg: No edema.     Left lower leg: No edema.    Studies Reviewed: Marland Kitchen     EKG:    EKG Interpretation Date/Time:  Friday July 20 2023 10:22:32 EST Ventricular Rate:  50 PR Interval:  202 QRS Duration:  84 QT Interval:  426 QTC Calculation: 388 R Axis:   24  Text Interpretation: EKG 07/20/2023: Normal  sinus rhythm at rate of 50 bpm, normal axis, no evidence of ischemia, normal EKG.  Compared to 07/19/2022, nonspecific T wave flattening not present. Confirmed by Delrae Rend [phone]) on 07/20/2023 10:38:52 AM    EKG 07/19/2022: Normal sinus rhythm with rate of 59 troponin, normal axis, nonspecific T wave flattening.   Medications and allergies    Allergies  Allergen Reactions   Oseltamivir Rash     Current Outpatient Medications:    diphenhydrAMINE (BENADRYL) 25 mg capsule, Take 25 mg by mouth at bedtime., Disp: , Rfl:    famotidine (PEPCID) 20 MG tablet, Take 20 mg by mouth daily., Disp: , Rfl:    fexofenadine (ALLEGRA) 180 MG tablet, Take 180 mg by mouth at bedtime. , Disp: , Rfl:    guaiFENesin (MUCINEX) 600 MG 12 hr tablet, Take 600 mg by  mouth daily., Disp: , Rfl:    metoprolol succinate (TOPROL-XL) 100 MG 24 hr tablet, Take 1 tablet by mouth daily., Disp: , Rfl:    rivaroxaban (XARELTO) 20 MG TABS tablet, Take 20 mg by mouth., Disp: , Rfl:    sodium chloride (OCEAN) 0.65 % SOLN nasal spray, Place 1 spray into both nostrils as needed for congestion., Disp: , Rfl:    rosuvastatin (CRESTOR) 10 MG tablet, Take 1 tablet (10 mg total) by mouth daily., Disp: 90 tablet, Rfl: 3   ASSESSMENT AND PLAN: .      ICD-10-CM   1. Paroxysmal atrial fibrillation (HCC)  I48.0 EKG 12-Lead    2. Essential hypertension  I10      Assessment and Plan    Atrial Fibrillation (AFib)   He reports no recent episodes of AFib and is effectively controlled on Xarelto 20 mg once daily for anticoagulation. No additional metoprolol is needed for heart rate control. Continue Xarelto 20 mg once daily.  He is markedly bradycardic although were completely asymptomatic and does not tolerate elevated heart rate.  Hence continue high-dose metoprolol succinate 100 mg daily.  Hypertension   His blood pressure is well-managed with metoprolol 100 mg once daily, with no reports of elevated blood pressure or related symptoms. Continue metoprolol 100 mg once daily.  Hyperlipidemia   Cholesterol levels are well-controlled with Crestor 10 mg once daily, indicating effective management. Continue Crestor 10 mg once daily.  Pre-diabetes   A1c improved from 6.2% to 5.9%, reflecting better glycemic control. Dietary changes and initiation of resistance training and aerobic exercises likely contribute to this improvement. Encourage continued dietary management and exercise.  Follow-up   He is doing well and may be released to his family doctor for routine care, with cardiology consultation available if needed. Release to family doctor for routine care, instruct to contact cardiologist as needed, and send a note to family doctor Mady Gemma.           Signed,  Yates Decamp, MD, [facility] 07/21/2023, 3:54 PM [facility] [address] [place], [place] [address] Phone: [phone]. Fax:  [phone]

## 2023-09-17 ENCOUNTER — Telehealth: Payer: Self-pay

## 2023-09-17 DIAGNOSIS — G4733 Obstructive sleep apnea (adult) (pediatric): Secondary | ICD-10-CM

## 2023-09-17 NOTE — Telephone Encounter (Signed)
 That's fine. Pressure 8-10cm h20 FU in 31-90 days for compliance check

## 2023-09-17 NOTE — Telephone Encounter (Signed)
 Ordered patient a new CPAP.  Scheduled patient for CPAP compliance OV 12/07/2023.  Patient verbalized understanding.

## 2023-09-17 NOTE — Telephone Encounter (Signed)
 Copied from CRM [id number]. Topic: Clinical - Prescription Issue >> Sep 17, 2023  8:24 AM Crist Dominion wrote: Reason for CRM: Patient is requesting Eulas Hick to order him a new CPAP machine as his current machine is bad. Patient states the provider and him discussed this at his last appointment. Please call patient with details/ an update on this when available.  Please advise Beth pt would like new cpap machine.

## 2023-12-07 ENCOUNTER — Encounter: Payer: Self-pay | Admitting: Primary Care

## 2023-12-07 ENCOUNTER — Ambulatory Visit: Admitting: Primary Care

## 2023-12-07 VITALS — BP 142/85 | HR 51 | Temp 97.6°F | Ht 66.0 in | Wt 241.8 lb

## 2023-12-07 DIAGNOSIS — G4733 Obstructive sleep apnea (adult) (pediatric): Secondary | ICD-10-CM

## 2023-12-07 NOTE — Patient Instructions (Signed)
  VISIT SUMMARY: During your visit today, we discussed your ongoing management of obstructive sleep apnea, atrial fibrillation, and hypertension. You have been using your CPAP machine effectively for sleep apnea, and we reviewed your recent weight gain and its potential impact on your condition. We also talked about your atrial fibrillation episodes and your current medications. Lastly, we reviewed your hypertension management.  YOUR PLAN: -OBSTRUCTIVE SLEEP APNEA: Obstructive sleep apnea is a condition where your breathing stops and starts repeatedly during sleep. Your CPAP therapy is working well, with high compliance and well-controlled apnea. We discussed your recent weight gain and its potential impact on your sleep apnea. You prefer to manage your weight through diet and exercise rather than medication. Continue using your CPAP machine with the current settings and aim for weight loss through diet and exercise. We will schedule your annual CPAP supply renewal, and you have the option for an in-person or virtual visit.  -ATRIAL FIBRILLATION: Atrial fibrillation is an irregular and often rapid heart rate that can increase your risk of strokes and other heart-related complications. You have been experiencing intermittent episodes of heart fluttering. Continue taking Xarelto for blood thinning and Toprolol 100 mg extended release daily to manage your condition. Your cardiologist has discharged you from further follow-up.  INSTRUCTIONS: Please continue using your CPAP machine as directed and aim for weight loss through diet and exercise. Continue taking your medications for atrial fibrillation and hypertension as prescribed. Schedule your annual CPAP supply renewal, and you can choose between an in-person or virtual visit for this. If you experience any new or worsening symptoms, please contact our office.  Follow-up 1 year with Beth NP for CPAP compliance

## 2023-12-07 NOTE — Progress Notes (Signed)
 @Patient  ID: Joe Underwood, male    DOB: 1950-09-29, 73 y.o.   MRN: [id number]  Chief Complaint  Patient presents with   Follow-up    OSA    Referring provider: Ladona Heinz, MD  HPI: 73 year old male, former smoker.  Past medical history significant for hypertension, A-fib, OSA.  Former patient of Dr. Shellia.  Previous [facility] pulmonary encounter:  03/07/2023 Patient presents today for annual OSA follow-up. He is doing well. No acute complaints.  He is eligible for replacement CPAP machine in 2024, he'd like to wait until after the holidays to get a new machine. Current pressure 8-10cm h20. He uses a travel CPAP machine when he goes to the beach, unsure pressure setting but thinks its set at 4cm h20.   Airview download 02/06/2023 - 03/07/2023 Usage 30/30 days (100%); 29 days (97%) greater than 4 hours Average usage 11 hours 27 minutes Pressure 8 to 10 cm H2O Air leak 6.9 L/min (95%) AHI 1.8   12/07/2023- Interim hx  Discussed the use of AI scribe software for clinical note transcription with the patient, who gave verbal consent to proceed.  History of Present Illness   Joe Underwood is a 73 year old male with sleep apnea who presents for a pulmonary follow-up.  He has a long-standing diagnosis of sleep apnea and has been using a CPAP machine consistently for approximately 20 years, experiencing noticeable benefits. His current CPAP machine, received in May, is functioning well without issues related to pressure or the mask. He uses the CPAP 93% of the time, averaging 10 hours and 34 minutes per night, with a pressure setting between 8 and 10, and his AHI is 1.3 events per hour.  He mentions a weight gain of about 10 to 15 pounds since his last visit. He has not pursued any specific weight loss interventions recently.  He has a history of atrial fibrillation, experiencing episodes where his heart feels like it is 'flipping' or fluttering. These episodes occur intermittently,  with the most recent episode happening approximately two weeks ago, lasting around 20 hours. He is currently taking Xarelto as a blood thinner and Toprolol extended release 100 mg once daily to manage his condition.      Airview download 07/08/33-10/06/23 Usage days 84/90 (93%); 82 days > 4 hours (91%) Average usage 10 hours 34 mins Pressure 8-10cm h20 Airleaks 3.1L/min AHI 1.3   Allergies  Allergen Reactions   Oseltamivir Rash    Immunization History  Administered Date(s) Administered   Influenza Split 03/17/2013, 02/11/2018, 12/28/2018   Influenza, High Dose Seasonal PF 02/23/2016, 02/01/2017, 02/18/2018, 01/09/2019   Influenza, Seasonal, Injecte, Preservative Fre 03/17/2013   Influenza,inj,Quad PF,6+ Mos 01/14/2016   Influenza,inj,quad, With Preservative 01/26/2014, 01/14/2016   Influenza-Unspecified 03/17/2013, 02/11/2018, 12/28/2018   PFIZER Comirnaty(Gray Top)Covid-19 Tri-Sucrose Vaccine 08/16/2020, 01/21/2021   PFIZER(Purple Top)SARS-COV-2 Vaccination 06/06/2019, 06/27/2019, 01/14/2020   Pfizer Covid-19 Vaccine Bivalent Booster 13yrs & up 08/15/2021   Pfizer(Comirnaty)Fall Seasonal Vaccine 12 years and older 02/28/2022, 08/09/2022, 02/15/2023   Pneumococcal Conjugate-13 05/04/2014   Pneumococcal Polysaccharide-23 05/18/2016   Tdap 03/17/2013   Zoster Recombinant(Shingrix) 06/13/2017   Zoster, Live 03/07/2013    Past Medical History:  Diagnosis Date   Asthma    Essential hypertension    GERD (gastroesophageal reflux disease)    HTN (hypertension)    Hyperlipidemia    OSA on CPAP    Paroxysmal atrial fibrillation (HCC) 04/30/2019   Stroke (HCC) 2003; 03/2016   denies any residual on 05/23/2016  Tobacco History: Social History   Tobacco Use  Smoking Status Former   Current packs/day: 0.00   Average packs/day: 1 pack/day for 13.0 years (13.0 ttl pk-yrs)   Types: Cigarettes   Start date: 06/12/1967   Quit date: 06/11/1980   Years since quitting: 43.5   Smokeless Tobacco Never   Counseling given: Not Answered   Outpatient Medications Prior to Visit  Medication Sig Dispense Refill   diphenhydrAMINE (BENADRYL) 25 mg capsule Take 25 mg by mouth at bedtime.     famotidine  (PEPCID ) 20 MG tablet Take 20 mg by mouth daily.     fexofenadine (ALLEGRA) 180 MG tablet Take 180 mg by mouth at bedtime.      guaiFENesin (MUCINEX) 600 MG 12 hr tablet Take 600 mg by mouth daily.     metoprolol succinate (TOPROL-XL) 100 MG 24 hr tablet Take 1 tablet by mouth daily.     rivaroxaban (XARELTO) 20 MG TABS tablet Take 20 mg by mouth.     rosuvastatin  (CRESTOR ) 10 MG tablet Take 1 tablet (10 mg total) by mouth daily. 90 tablet 3   sodium chloride  (OCEAN) 0.65 % SOLN nasal spray Place 1 spray into both nostrils as needed for congestion.     No facility-administered medications prior to visit.   Review of Systems  Review of Systems  Constitutional: Negative.   HENT: Negative.    Respiratory: Negative.    Cardiovascular: Negative.    Physical Exam  BP (!) 142/85 (BP Location: Left Arm, Patient Position: Sitting, Cuff Size: Large)   Pulse (!) 51   Temp 97.6 F (36.4 C) (Temporal)   Ht 5' 6 (1.676 m)   Wt 241 lb 12.8 oz (109.7 kg)   SpO2 96%   BMI 39.03 kg/m  Physical Exam Constitutional:      General: He is not in acute distress.    Appearance: Normal appearance. He is obese. He is not ill-appearing.  HENT:     Head: Normocephalic and atraumatic.  Cardiovascular:     Rate and Rhythm: Normal rate and regular rhythm.  Pulmonary:     Effort: Pulmonary effort is normal.     Breath sounds: Normal breath sounds.  Musculoskeletal:        General: Normal range of motion.  Skin:    General: Skin is warm and dry.  Neurological:     General: No focal deficit present.     Mental Status: He is alert and oriented to person, place, and time. Mental status is at baseline.  Psychiatric:        Mood and Affect: Mood normal.        Behavior: Behavior  normal.        Thought Content: Thought content normal.        Judgment: Judgment normal.      Lab Results:  CBC    Component Value Date/Time   WBC 5.1 06/30/2016 0117   RBC 5.01 06/30/2016 0117   HGB 15.3 06/30/2016 0117   HCT 45.3 06/30/2016 0117   PLT 156 06/30/2016 0117   MCV 90.4 06/30/2016 0117   MCH 30.5 06/30/2016 0117   MCHC 33.8 06/30/2016 0117   RDW 13.5 06/30/2016 0117    BMET    Component Value Date/Time   NA 142 06/30/2016 0117   K 4.1 06/30/2016 0117   CL 106 06/30/2016 0117   CO2 22 06/30/2016 0117   GLUCOSE 86 06/30/2016 0117   BUN 15 06/30/2016 0117   CREATININE 1.05 06/30/2016 0117  CALCIUM  9.0 06/30/2016 0117   GFRNONAA >60 06/30/2016 0117   GFRAA >60 06/30/2016 0117    BNP No results found for: BNP  ProBNP No results found for: PROBNP  Imaging: No results found.   Assessment & Plan:   1. OSA on CPAP (Primary)  Assessment and Plan    Obstructive sleep apnea Obstructive sleep apnea diagnosed 20 years ago, well controlled with CPAP therapy. CPAP compliance is high with 93% usage over the last three months, averaging 10 hours and 34 minutes per night. Current pressure 8-10cm h20; AHI is 1.3, indicating well-controlled apnea. Weight has increased by 10-15 pounds, which may impact OSA. Discussed potential use of Zepbound for weight loss and OSA management, but he prefers diet and exercise. Informed that Zepbound is approved for OSA and obesity treatment, but not all insurances cover it. Self-pay option is $499.  - Continue CPAP therapy with current settings (pressure between 8 and 10). - Encourage weight loss through diet and exercise. - Schedule annual CPAP supply renewal, with option for in-person or virtual visit.  Atrial fibrillation Intermittent atrial fibrillation with recent episode lasting approximately 20 hours. Currently managed with Xarelto and Toprolol. Cardiologist has discharged him from further follow-up. - Continue  Xarelto for anticoagulation. - Continue Toprolol-XL 100 mg daily.  Almarie LELON Ferrari, NP 12/07/2023

## 2024-03-10 ENCOUNTER — Ambulatory Visit: Payer: Medicare PPO | Admitting: Primary Care
